# Patient Record
Sex: Male | Born: 1945 | Race: White | Hispanic: No | Marital: Married | Smoking: Former smoker
Health system: Southern US, Community
[De-identification: ages and names within clinical notes are randomized; demographics above are authoritative.]

## PROBLEM LIST (undated history)

## (undated) DIAGNOSIS — I739 Peripheral vascular disease, unspecified: Secondary | ICD-10-CM

## (undated) DIAGNOSIS — C801 Malignant (primary) neoplasm, unspecified: Secondary | ICD-10-CM

## (undated) DIAGNOSIS — E785 Hyperlipidemia, unspecified: Secondary | ICD-10-CM

## (undated) DIAGNOSIS — R600 Localized edema: Secondary | ICD-10-CM

## (undated) DIAGNOSIS — I714 Abdominal aortic aneurysm, without rupture, unspecified: Secondary | ICD-10-CM

## (undated) DIAGNOSIS — I10 Essential (primary) hypertension: Secondary | ICD-10-CM

## (undated) HISTORY — DX: Essential (primary) hypertension: I10

## (undated) HISTORY — PX: CAROTID ENDARTERECTOMY: SUR193

## (undated) HISTORY — DX: Localized edema: R60.0

## (undated) HISTORY — PX: PROSTATE BIOPSY: SHX241

## (undated) HISTORY — DX: Peripheral vascular disease, unspecified: I73.9

## (undated) HISTORY — DX: Hyperlipidemia, unspecified: E78.5

## (undated) HISTORY — PX: COLONOSCOPY: SHX174

## (undated) HISTORY — DX: Malignant (primary) neoplasm, unspecified: C80.1

## (undated) HISTORY — DX: Abdominal aortic aneurysm, without rupture, unspecified: I71.40

---

## 2009-05-13 ENCOUNTER — Ambulatory Visit: Payer: Self-pay | Admitting: Vascular Surgery

## 2009-06-17 ENCOUNTER — Ambulatory Visit: Payer: Self-pay | Admitting: Vascular Surgery

## 2009-06-19 ENCOUNTER — Encounter: Payer: Self-pay | Admitting: Vascular Surgery

## 2009-06-19 ENCOUNTER — Ambulatory Visit (HOSPITAL_COMMUNITY): Admission: RE | Admit: 2009-06-19 | Discharge: 2009-06-19 | Payer: Self-pay | Admitting: Vascular Surgery

## 2009-06-19 ENCOUNTER — Ambulatory Visit: Payer: Self-pay | Admitting: Vascular Surgery

## 2009-07-08 ENCOUNTER — Ambulatory Visit: Payer: Self-pay | Admitting: Vascular Surgery

## 2009-07-10 HISTORY — PX: PR VEIN BYPASS GRAFT,AORTO-FEM-POP: 35551

## 2009-07-21 ENCOUNTER — Ambulatory Visit: Payer: Self-pay | Admitting: Vascular Surgery

## 2009-07-21 ENCOUNTER — Inpatient Hospital Stay (HOSPITAL_COMMUNITY): Admission: RE | Admit: 2009-07-21 | Discharge: 2009-07-27 | Payer: Self-pay | Admitting: Vascular Surgery

## 2009-07-21 ENCOUNTER — Encounter: Payer: Self-pay | Admitting: Vascular Surgery

## 2009-07-22 ENCOUNTER — Encounter: Payer: Self-pay | Admitting: Vascular Surgery

## 2009-07-24 ENCOUNTER — Encounter: Payer: Self-pay | Admitting: Vascular Surgery

## 2009-08-07 ENCOUNTER — Ambulatory Visit: Payer: Self-pay | Admitting: Vascular Surgery

## 2009-08-12 ENCOUNTER — Ambulatory Visit: Payer: Self-pay | Admitting: Vascular Surgery

## 2009-10-14 ENCOUNTER — Ambulatory Visit: Payer: Self-pay | Admitting: Vascular Surgery

## 2010-01-20 ENCOUNTER — Ambulatory Visit: Payer: Self-pay | Admitting: Vascular Surgery

## 2010-07-22 ENCOUNTER — Ambulatory Visit: Payer: Self-pay | Admitting: Vascular Surgery

## 2011-01-13 LAB — HEPARIN LEVEL (UNFRACTIONATED)
Heparin Unfractionated: 0.25 IU/mL — ABNORMAL LOW (ref 0.30–0.70)
Heparin Unfractionated: 0.35 IU/mL (ref 0.30–0.70)

## 2011-01-13 LAB — COMPREHENSIVE METABOLIC PANEL
Albumin: 4.1 g/dL (ref 3.5–5.2)
Alkaline Phosphatase: 76 U/L (ref 39–117)
BUN: 7 mg/dL (ref 6–23)
CO2: 30 mEq/L (ref 19–32)
Calcium: 9.8 mg/dL (ref 8.4–10.5)
Creatinine, Ser: 0.76 mg/dL (ref 0.4–1.5)
GFR calc non Af Amer: >60 mL/min (ref >60)
Total Bilirubin: 0.6 mg/dL (ref 0.3–1.2)
Total Protein: 6.5 g/dL (ref 6.0–8.3)

## 2011-01-13 LAB — PROTIME-INR
INR: 1.37 (ref 0.00–1.49)
INR: 1.76 — ABNORMAL HIGH (ref 0.00–1.49)
Prothrombin Time: 16.8 seconds — ABNORMAL HIGH (ref 11.6–15.2)
Prothrombin Time: 20.4 seconds — ABNORMAL HIGH (ref 11.6–15.2)

## 2011-01-13 LAB — CBC
HCT: 31.1 % — ABNORMAL LOW (ref 39.0–52.0)
HCT: 31.3 % — ABNORMAL LOW (ref 39.0–52.0)
Hemoglobin: 10.8 g/dL — ABNORMAL LOW (ref 13.0–17.0)
Hemoglobin: 10.8 g/dL — ABNORMAL LOW (ref 13.0–17.0)
Hemoglobin: 11.2 g/dL — ABNORMAL LOW (ref 13.0–17.0)
Hemoglobin: 12.2 g/dL — ABNORMAL LOW (ref 13.0–17.0)
MCHC: 34.3 g/dL (ref 30.0–36.0)
MCHC: 34.7 g/dL (ref 30.0–36.0)
MCHC: 34.4 g/dL (ref 30.0–36.0)
MCV: 90.7 fL (ref 78.0–100.0)
MCV: 89.5 fL (ref 78.0–100.0)
Platelets: 210 10*3/uL (ref 150–400)
RBC: 3.43 MIL/uL — ABNORMAL LOW (ref 4.22–5.81)
RBC: 3.46 MIL/uL — ABNORMAL LOW (ref 4.22–5.81)
RDW: 14.1 % (ref 11.5–15.5)
RDW: 14.7 % (ref 11.5–15.5)
RDW: 14.8 % (ref 11.5–15.5)
WBC: 8.3 10*3/uL (ref 4.0–10.5)
WBC: 8.3 10*3/uL (ref 4.0–10.5)
WBC: 9.1 10*3/uL (ref 4.0–10.5)

## 2011-01-13 LAB — BASIC METABOLIC PANEL
BUN: 5 mg/dL — ABNORMAL LOW (ref 6–23)
Calcium: 7.7 mg/dL — ABNORMAL LOW (ref 8.4–10.5)
GFR calc Af Amer: >60 mL/min (ref >60)
Glucose, Bld: 135 mg/dL — ABNORMAL HIGH (ref 70–99)

## 2011-01-13 LAB — URINALYSIS, ROUTINE W REFLEX MICROSCOPIC
Bilirubin Urine: NEGATIVE
Nitrite: NEGATIVE
Protein, ur: NEGATIVE mg/dL

## 2011-01-13 LAB — TYPE AND SCREEN: Antibody Screen: NEGATIVE

## 2011-01-13 LAB — POCT I-STAT 4, (NA,K, GLUC, HGB,HCT)
Glucose, Bld: 149 mg/dL — ABNORMAL HIGH (ref 70–99)
HCT: 33 % — ABNORMAL LOW (ref 39.0–52.0)
Hemoglobin: 11.2 g/dL — ABNORMAL LOW (ref 13.0–17.0)
Hemoglobin: 8.8 g/dL — ABNORMAL LOW (ref 13.0–17.0)
Potassium: 3.7 mEq/L (ref 3.5–5.1)

## 2011-01-13 LAB — APTT: aPTT: 27 seconds (ref 24–37)

## 2011-01-13 LAB — ABO/RH: ABO/RH(D): AB POS

## 2011-02-22 NOTE — Procedures (Signed)
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   HISTORY:  Diabetes:  No.  Cardiac:  No.  Hypertension:  Yes.   EXAM:  Bilateral popliteal artery duplex.   IMPRESSION:  1. Patent right popliteal artery with measurements:  P=0.96 cm, M=1.18      cm, D=0.85 cm.  2. Occluded left popliteal artery with measurements:  P=1.69 cm,      M=1.34 cm, D=1.3 cm.    ___________________________________________  [name], MD   AS/MEDQ  D:  07/08/2009  T:  07/08/2009  Job:  [id number]

## 2011-02-22 NOTE — Assessment & Plan Note (Signed)
OFFICE VISIT   JUNE, VACHA  DOB:  06-01-1946                                       08/07/2009  ZOXWR#:[id number]   Francena Hanly presents today for a nurse visit for left lower tendon  bypass.  He has an appointment to see Dr. Darrick Penna in 1 week.  He asked if  I would check due to some oozing from his left popliteal incision.  He  does have a normal 2+ popliteal pulse.  His foot is well-perfused.  He  does have the usual amount of swelling.  He is on Coumadin for  postoperative DVT in the left superficial femoral vein.  He does have  some liquefying hematoma from the popliteal space.  I explained that  this is not in the circulation bleeding and that this will continue to  evacuate over time.  He was reassured of this discussion and will see  Dr. Darrick Penna as scheduled.   Larina Earthly, M.D.  Electronically Signed   TFE/MEDQ  D:  08/07/2009  T:  08/10/2009  Job:  [id number]

## 2011-02-22 NOTE — Assessment & Plan Note (Signed)
OFFICE VISIT   Levi Mcconnell, Levi Mcconnell  DOB:  1946-01-03                                       10/14/2009  EAVWU#:[id number]   The patient returns for followup today.  He previously underwent left  femoral to posterior tibial bypass in October of 2010.  He also had a  DVT at the time of his bypass secondary to a superficial femoral vein  injury.  He returns today for further followup.  He states he has  essentially returned to normal activities at this point.  He still has  some swelling in the left lower extremity but this resolves some with  elevation of the leg.  He has some occasional numbness and tingling in  his toes but overall the foot is improved.  I am pleased to report that  he has quit smoking at this time and I congratulated him on this.   PHYSICAL EXAM:  Today blood pressure is 156/97 in the left arm, heart  rate 63 and regular, temperature 98.  Left lower extremity has a 2+  femoral and 2+ posterior tibial pulse.  He has trace swelling in the  left lower extremity with the left lower extremity being approximately  10% larger than the right in the calf to the foot.  He has no open  ulcerations on the feet.   He had a graft duplex scan today which shows a widely patent bypass  graft with an ABI of 1.15 on the left and 1.05 on the right.  He had a  DVT ultrasound today which shows the superficial femoral vein is widely  patent with no residual areas of thrombus.   The patient is doing well at this point.  He does have some residual  swelling which I think is more due to reperfusion rather than his prior  DVT.  I did talk with him today about using a compression garment if the  swelling is bothersome to him long-term.  He prefers to defer this for  now.  Since his DVT has resolved and he has had 3 months of total  Coumadin therapy I believe we can discontinue his Coumadin at this time.  He will follow up in our graft surveillance protocol.     [name], MD  Electronically Signed   CEF/MEDQ  D:  10/14/2009  T:  10/15/2009  Job:  [id number]   cc:   Durenda Hurt, M.D.

## 2011-02-22 NOTE — Assessment & Plan Note (Signed)
OFFICE VISIT   HOY, FALLERT  DOB:  December 18, 1945                                       08/12/2009  GEXBM#:[id number]   The patient returns for followup today after undergoing a left femoral  to posterior tibial artery bypass with endarterectomy of the left tibia  peroneal trunk and repair of the left superficial femoral vein.  He did  have a DVT postoperatively.  He is currently on Coumadin for this.   He states that his walking is improved.  His rest pain in his left foot  has completely resolved.   On exam today ABIs were 1.12 on the right and 1.11 on the left.  Blood  pressure is 108/75, temperature 98.4, heart rate 69 and regular.  Left  lower extremity incisions are healing well.  He does have some edema in  the left lower extremity.  This does not really proceed into the thigh.   Overall the patient is doing well.  He will return in 2 months' time for  an ultrasound to reassess his lower extremity veins for presence of DVT.  He will return to work on November 22.  I have encouraged him to  continue to elevate his leg to prevent edema in the long-term.   [name], MD  Electronically Signed   CEF/MEDQ  D:  08/12/2009  T:  08/13/2009  Job:  [id number]   cc:   Durenda Hurt, M.D.

## 2011-02-22 NOTE — Assessment & Plan Note (Signed)
OFFICE VISIT   Levi Mcconnell, Levi Mcconnell  DOB:  June 25, 1946                                       05/13/2009  ZOXWR#:[id number]   The patient is a 65 year old male who has had progressive worsening of  left calf claudication over the last 2 months.  He states that the pain  does occur on a daily basis but sometimes his distances are longer than  others.  He states that on average he is able to walk approximately 1  block.  He also has some element of rest pain and has pain in his left  foot at night time that keeps him from sleeping.  He occasionally takes  a half a Vicodin tablet for this.   His atherosclerotic risk factors include elevated cholesterol,  hypertension and smoking.  He currently smokes 1 pack of cigarettes per  day.  We had a lengthy discussion greater than 5 minutes today on the  risks, benefits and techniques for smoking cessation.  He will try to  quit.   PAST MEDICAL HISTORY:  Is otherwise as listed above.   FAMILY HISTORY:  Unremarkable.   SOCIAL HISTORY:  He is married, has two children.  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  Smoking history as above.  He does not consume  alcohol.   REVIEW OF SYSTEMS:  GENERAL:  He is 5 feet 11 inches, 167 pounds.  Cardiac, pulmonary, GI, renal, vascular, neurologic, orthopedic,  psychiatric, ENT, hematologic review of systems are otherwise negative.   MEDICATIONS:  1. Enalapril 2.5 mg once daily.  2. Simvastatin 10 mg once a day.  3. Vicodin p.r.n.   ALLERGIES:  He has no known drug allergies.   PHYSICAL EXAM:  Blood pressure is 111/77 in the left arm, pulse is 100  and regular.  HEENT:  Unremarkable.  Neck:  Has 2+ carotid pulses  without bruit.  Chest:  Clear to auscultation.  Heart:  Regular rate and  rhythm without murmur.  Abdomen:  Is soft, nontender, nondistended.  No  masses.  He does have a right upper abdomen sebaceous cyst which has  been chronic in character and is not inflamed.   Upper extremities:  He  has 2+ brachial pulses and 1+ radial pulses bilaterally.  Lower  extremities:  He has 2+ femoral pulses bilaterally.  He has a 1+ right  popliteal pulse and a 2+ right posterior tibial pulse.  He has absent  popliteal and pedal pulses in the left leg.  He has absent right  dorsalis pedis pulse.  Feet are pink, warm and reasonably well-perfused.  He has no ulcerations on the feet.  He has no edema in the lower  extremities.  Neurologic:  Shows symmetric upper extremity and lower  extremity motor strength which is 5/5.   He had ABIs performed prior to his office visit today which were  reviewed.  These were done on 05/11/2009, shows an ABI on the left of  0.44 and on the right of 1.0.  Posterior tibial absolute pressure was 70  mmHg.   In summary, the patient has short distance claudication in his left  lower extremity and some element of early rest pain.  He has ABIs  consistent with that as well.   I discussed with the patient and his son today options of medical  management versus a  revascularization intervention at this point.  Although his ABI is 0.44 I believe that we may be able to make some  progress if he is able to quit smoking and start walking 30 minutes  daily.  I have also started him on Pletal today.  Since he does have  some element of rest pain we will follow him fairly closely.  He will  return to the office in 1 month's time to see if he has had any  improvement and we will repeat his ABIs at that time.  If he has not had  significant improvement at that time we may need to consider an  intervention at that point.   [name], MD  Electronically Signed   CEF/MEDQ  D:  05/13/2009  T:  05/14/2009  Job:  [id number]   cc:   Durenda Hurt, M.D.

## 2011-02-22 NOTE — Assessment & Plan Note (Signed)
OFFICE VISIT   Levi Mcconnell, Levi Mcconnell  DOB:  [birth year]                                       07/08/2009  FAOZH#:[id number]   Patient returns for follow-up today.  He underwent aortogram of  bilateral lower extremity runoff on September 10th.  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  He had 2-vessel runoff via the  peroneal artery and posterior tibial artery.  However, he also had an  area of occlusion in the posterior tibial artery at the level of the  ankle.  He still describes very short distance claudication at about 5  to 10 steps in his left leg.  He also has symptoms of early rest pain  with some pins and needles and tingling in his left foot.  He is  requiring narcotic pain medication at nighttime for sleep.   His atherosclerotic risk factors remain elevated cholesterol,  hypertension, smoking.  He is trying to continue to cut back on smoking.   PAST MEDICAL HISTORY:  Unremarkable.   SOCIAL HISTORY:  He is married.  He has 2 children.  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  He does not consume alcohol regularly.   FAMILY HISTORY:  His father had a thoracic aneurysm.   MEDICATIONS:  Enalapril 2.5 mg once a day, simvastatin 10 mg once a day,  Vicodin p.r.n., Pletal 100 mg twice a day.   PHYSICAL EXAMINATION:  Blood pressure is 155/94 in the left arm.  Pulse  is 70 and regular.  HEENT is unremarkable.  Neck has 2+ carotid pulses  without a bruit.  Chest:  Clear to auscultation.  Cardiac:  Regular rate  and rhythm without murmur.  Abdomen is soft, nontender, no pulsatile  mass.  Extremities:  He has 2+ femoral pulses bilaterally but has absent  popliteal and pedal pulses on the left side.  He has a 1+ right  popliteal pulse and a 2+ right posterior tibial pulse.  Most recent ABIs  were 0.38 and 1.13 on the right.   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  Of note, his recent arteriogram  showed that he also had a small infrarenal abdominal aortic aneurysm.   In summary, Mr. Levi Mcconnell has very short-distance claudication and early  rest pain in his left foot.  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  I believe the best option for his left leg at  this point would be a left femoral to tibial artery bypass, most likely  using either the peroneal, posterior tibial, or even the tibioperoneal  trunk.  If I find that this is patent, we will try to use a vein conduit  for this.  I discussed with him today that if the left leg vein was not  usable, we would consider using vein from the right leg for longer  durability.  Risks, benefits, possible complications and procedure  details were explained to the patient today, including but not limited  to bleeding, infection, myocardial events, graft occlusion, and limb  loss long-term.  He understands and agrees  to proceed.  This is  scheduled for July 21, 2009.  At some point during his follow-up, we  will obtain an aortic ultrasound to determine the precise diameter of  his abdominal aorta.   Levi Hora. Fields, MD  Electronically Signed   CEF/MEDQ  D:  07/08/2009  T:  07/09/2009  Job:  [id number]   cc:   Durenda Hurt, M.D.

## 2011-02-22 NOTE — Assessment & Plan Note (Signed)
OFFICE VISIT   Levi Mcconnell, COLARUSSO  DOB:  1945-12-29                                       06/17/2009  VHQIO#:[id number]   The patient returns today for further follow-up for claudication  symptoms.  He was last seen in August 2010.  He is a 65 year old male  who has short distance claudication and early rest pain symptoms in the  left foot.  Since he was last seen, unfortunately, he has still not been  able to quit smoking but has made attempts to try this.  He can walk for  up to 15-20 minutes at a time before experiencing claudication symptoms  in his left leg.  However, he has episodes at night time where his left  foot aches and burns and wakes him up several times from sleep at night.  His wife was here for the office visit today and she states that he is  getting up 1-2 times at night to rub his left foot and move around and  take pain medication.  His atherosclerotic risk factors continue to  remain elevated cholesterol, hypertension and smoking.  We again  discussed smoking cessation today.   PAST MEDICAL HISTORY:  Unremarkable.   SOCIAL HISTORY:  He is married.  He has 2 children.  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  He does not consume alcohol regularly.   MEDICATIONS:  Enalapril 2.5 mg once a day, simvastatin 10 mg once a day,  Vicodin p.r.n., Pletal 100 mg twice a day.   PHYSICAL EXAM:  Blood pressure is 134/96 in the left arm, pulse is 65  and regular.  Lower Extremities:  He has 2+ femoral pulses, he has a 1+  right popliteal pulse and a 2+ right posterior tibial pulse.  He has  absent popliteal and pedal pulses on the left side.  He had bilateral  ABIs performed today which were 0.38 on the left and 1.13 on the right,  he had monophasic waveforms on the left.   The patient has not had significant improvement in his symptoms with  Pletal.  I believe the best option for him at this point would be an  arteriogram with lower extremity  runoff.  His ABIs are reduced  significantly enough that he certainly has some rest pain symptoms  attributable to this.  I did describe with him today that the perfusion  to his left foot is significant enough that he is at risk of limb loss.  I discussed with him today the risks, benefits, possible complications  and procedure details of arteriography as well as possible angioplasty  and stenting in his left leg.  These included, but not limited to  bleeding, vessel injury, contrast reaction.  He understands and agrees  to proceed.  His arteriogram is scheduled for June 19, 2009.   [name], MD  Electronically Signed   CEF/MEDQ  D:  06/17/2009  T:  06/18/2009  Job:  [id number]

## 2011-02-22 NOTE — Procedures (Signed)
BYPASS GRAFT EVALUATION   INDICATION:  Followup left femoral-posterior tibial artery bypass graft.   HISTORY:  Diabetes:  No.  Cardiac:  No.  Hypertension:  On medications.  Smoking:  Previous.  Previous Surgery:  Left femoral-posterior tibial artery bypass graft  07/21/2009 by Dr. Darrick Penna.   SINGLE LEVEL ARTERIAL EXAM                               RIGHT              LEFT  Brachial:                    132                138  Anterior tibial:             150                145  Posterior tibial:            161                161  Peroneal:  Ankle/brachial index:        1.17               1.17   PREVIOUS ABI:  Date:  10/14/2009  RIGHT:  1.05  LEFT:  1.15   LOWER EXTREMITY BYPASS GRAFT DUPLEX EXAM:   DUPLEX:  Doppler arterial waveforms appear biphasic proximal to, within  and distal to femoral-posterior tibial artery bypass graft.   IMPRESSION:  1. Patent left femoral-posterior tibial artery bypass graft.  2. Ankle brachial indices appear within normal limits and stable.  3. No significant changes from previous study.        ___________________________________________  Janetta Hora Fields, MD   AS/MEDQ  D:  01/20/2010  T:  01/20/2010  Job:  [id number]

## 2011-02-22 NOTE — Procedures (Signed)
DUPLEX DEEP VENOUS EXAM - LOWER EXTREMITY   INDICATION:  Follow up known DVT.   HISTORY:  Edema:  Minimal.  Trauma/Surgery:  Left bypass graft.  Pain:  No.  PE:  No.  Previous DVT:  Yes.  Anticoagulants:  Yes.  Other:   DUPLEX EXAM:                CFV   SFV   PopV  PTV    GSV                R  L  R  L  R  L  R   L  R  L  Thrombosis    o  o     o     o      o     o  Spontaneous   +  +     +     +      +     +  Phasic        +  +     +     +      +     +  Augmentation  +  +     +     +      +     +  Compressible  +  +     +     +      +     +  Competent     +  +     +     +      +     +   Legend:  + - yes  o - no  p - partial  D - decreased   IMPRESSION:  There does not appear to be any deep venous thrombus noted  in the left leg.    _____________________________  Janetta Hora Fields, MD   CB/MEDQ  D:  10/14/2009  T:  10/14/2009  Job:  [id number]

## 2011-02-22 NOTE — Procedures (Signed)
BYPASS GRAFT EVALUATION   INDICATION:  Evaluation of left femoral to posterior tibial artery  bypass graft   HISTORY:  Diabetes:  no  Cardiac:  no  Hypertension:  yes  Smoking:  Previous, quit 07/21/2009  Previous Surgery:  Left femoral to posterior tibial artery bypass graft  07/21/2009   SINGLE LEVEL ARTERIAL EXAM                               RIGHT              LEFT  Brachial:                    171                167  Anterior tibial:             179                189  Posterior tibial:            172                197  Peroneal:  Ankle/brachial index:        1.05               1.15   PREVIOUS ABI:  Date: 08/12/2009  RIGHT:  1.11  LEFT:  1.11   LOWER EXTREMITY BYPASS GRAFT DUPLEX EXAM:   DUPLEX:  Left femoral posterior tibial bypass graft appears patent with  biphasic waveforms noted within graft. There is no focal stenosis noted.   IMPRESSION:  1. Normal ankle brachial indices bilaterally.  2. Patent left femoral posterior tibial artery bypass graft.   ___________________________________________  [name], MD   CB/MEDQ  D:  10/14/2009  T:  10/14/2009  Job:  [id number]

## 2011-02-22 NOTE — Assessment & Plan Note (Signed)
OFFICE VISIT   Levi Mcconnell, Levi Mcconnell  DOB:  May 09, 1946                                       01/20/2010  ZOXWR#:[id number]   The patient returns for followup today.  He previously underwent left  femoral to posterior tibial artery bypass in October of 2010.  He  returns for further followup today.  He denies any new symptoms.  He  denies any symptoms of claudication.  He still has some occasional  numbness and tingling in his left foot.  The edema in his left leg has  almost completely resolved at this point.  He continues to elevate his  leg to prevent swelling.  He continues to refrain from smoking.  He is  on an aspirin daily.  He is now off his Chantix.   PAST MEDICAL HISTORY:  Is as previously mentioned and unchanged.   FAMILY HISTORY:  Unremarkable.   SOCIAL HISTORY:  He is married and has two children.  Currently works as  a Geneticist, molecular.  Has not been smoking since October of 2010.   REVIEW OF SYSTEMS:  He is 5 feet 11 inches, 170 pounds.  He denies  recent weight loss or gain.  He has no history of recent chest pain or  shortness of breath.   PHYSICAL EXAM:  Vital signs:  Blood pressure is 130/92 in the left arm,  pulse is 62 and regular.  Chest:  Clear to auscultation.  Cardiac:  Exam  is regular rate and rhythm without murmur.  Abdomen:  Soft, nontender,  nondistended.  No masses.  Extremities:  He has 2+ femoral pulses  bilaterally.  He has a 2+ left posterior tibial pulse.  He has no open  ulcerations on the feet.  He has a 2+ right posterior tibial pulse.   He had bilateral ABIs performed today which were 1.17 on the left, 1.17  on the right.  He had a graft duplex scan which showed no increased  velocities throughout the course of his graft.  This is essentially  unchanged from his last study.   The patient continues to do well status post his bypass graft.  Fortunately, he has been able to keep off the cigarettes.  He will  continue  to follow up in our graft surveillance protocol.     [name], MD  Electronically Signed   CEF/MEDQ  D:  01/21/2010  T:  01/21/2010  Job:  [id number]   cc:   Durenda Hurt, M.D.

## 2011-02-22 NOTE — Procedures (Signed)
BYPASS GRAFT EVALUATION   INDICATION:  Follow up femoral-to-posterior tibial artery bypass graft.   HISTORY:  Diabetes:  No.  Cardiac:  No.  Hypertension:  Yes.  Smoking:  Previous.  Previous Surgery:  Left fem-to-posterior tibial artery bypass graft on  07/21/2009 by Dr. Darrick Penna.   SINGLE LEVEL ARTERIAL EXAM                               RIGHT              LEFT  Brachial:                    141                138  Anterior tibial:             146                157  Posterior tibial:            175                162  Peroneal:  Ankle/brachial index:        1.24               1.15   PREVIOUS ABI:  Date: 01/20/2010  RIGHT:  1.17  LEFT:  1.17   LOWER EXTREMITY BYPASS GRAFT DUPLEX EXAM:   DUPLEX:  Doppler arterial waveforms appear biphasic proximal to, within,  and distal to femoral-to-posterior tibial artery bypass graft.   IMPRESSION:  1. Patent left femoral to posterior tibial artery bypass graft.  2. Ankle brachial indices appear within normal limits and stable.  3. No significant changes from previous study.        ___________________________________________  Janetta Hora Fields, MD   EM/MEDQ  D:  07/22/2010  T:  07/22/2010  Job:  [id number]

## 2011-06-14 ENCOUNTER — Encounter: Payer: Self-pay | Admitting: Vascular Surgery

## 2011-07-21 ENCOUNTER — Other Ambulatory Visit: Payer: Self-pay

## 2011-07-21 ENCOUNTER — Ambulatory Visit: Payer: Self-pay

## 2011-07-22 ENCOUNTER — Encounter: Payer: Self-pay | Admitting: *Deleted

## 2011-07-22 ENCOUNTER — Other Ambulatory Visit (INDEPENDENT_AMBULATORY_CARE_PROVIDER_SITE_OTHER): Payer: 59 | Admitting: *Deleted

## 2011-07-22 ENCOUNTER — Ambulatory Visit: Payer: Self-pay

## 2011-07-22 DIAGNOSIS — Z48812 Encounter for surgical aftercare following surgery on the circulatory system: Secondary | ICD-10-CM

## 2011-07-22 DIAGNOSIS — I739 Peripheral vascular disease, unspecified: Secondary | ICD-10-CM

## 2011-08-04 NOTE — Procedures (Unsigned)
BYPASS GRAFT EVALUATION  INDICATION:  Left lower extremity bypass graft.  HISTORY: Diabetes:  No. Cardiac:  No. Hypertension:  Yes. Smoking:  Previous. Previous Surgery:  Left femoral to posterior tibial artery bypass graft on 07/21/2009.  SINGLE LEVEL ARTERIAL EXAM                              RIGHT              LEFT Brachial: Anterior tibial: Posterior tibial: Peroneal: Ankle/brachial index:  PREVIOUS ABI:  Date:  RIGHT:  LEFT:  LOWER EXTREMITY BYPASS GRAFT DUPLEX EXAM:  DUPLEX:  Biphasic Doppler waveforms noted throughout the left lower extremity bypass graft and native vessels with no increase in velocity.  IMPRESSION: 1. Patent left femoral to posterior tibial artery bypass graft with no     stenosis. 2. Bilateral ankle brachial indices are noted in a separate worksheet.  ___________________________________________ [name], MD  CH/MEDQ  D:  07/26/2011  T:  07/26/2011  Job:  [id number]

## 2011-08-05 ENCOUNTER — Encounter: Payer: Self-pay | Admitting: Vascular Surgery

## 2011-08-08 ENCOUNTER — Other Ambulatory Visit: Payer: Self-pay

## 2011-08-08 DIAGNOSIS — I739 Peripheral vascular disease, unspecified: Secondary | ICD-10-CM

## 2011-08-08 DIAGNOSIS — Z48812 Encounter for surgical aftercare following surgery on the circulatory system: Secondary | ICD-10-CM

## 2011-09-28 DIAGNOSIS — C760 Malignant neoplasm of head, face and neck: Secondary | ICD-10-CM | POA: Insufficient documentation

## 2011-11-07 DIAGNOSIS — E785 Hyperlipidemia, unspecified: Secondary | ICD-10-CM | POA: Insufficient documentation

## 2012-02-10 DIAGNOSIS — C000 Malignant neoplasm of external upper lip: Secondary | ICD-10-CM | POA: Insufficient documentation

## 2012-08-03 ENCOUNTER — Other Ambulatory Visit: Payer: Self-pay | Admitting: *Deleted

## 2012-08-03 DIAGNOSIS — I739 Peripheral vascular disease, unspecified: Secondary | ICD-10-CM

## 2012-08-03 DIAGNOSIS — Z48812 Encounter for surgical aftercare following surgery on the circulatory system: Secondary | ICD-10-CM

## 2012-08-08 ENCOUNTER — Encounter: Payer: Self-pay | Admitting: Vascular Surgery

## 2012-08-09 ENCOUNTER — Ambulatory Visit (INDEPENDENT_AMBULATORY_CARE_PROVIDER_SITE_OTHER): Payer: 59 | Admitting: Vascular Surgery

## 2012-08-09 ENCOUNTER — Encounter (INDEPENDENT_AMBULATORY_CARE_PROVIDER_SITE_OTHER): Payer: 59 | Admitting: *Deleted

## 2012-08-09 ENCOUNTER — Encounter: Payer: Self-pay | Admitting: Vascular Surgery

## 2012-08-09 VITALS — BP 117/68 | HR 59 | Resp 18 | Ht 71.0 in | Wt 188.0 lb

## 2012-08-09 DIAGNOSIS — I739 Peripheral vascular disease, unspecified: Secondary | ICD-10-CM

## 2012-08-09 DIAGNOSIS — Z48812 Encounter for surgical aftercare following surgery on the circulatory system: Secondary | ICD-10-CM

## 2012-08-09 NOTE — Progress Notes (Signed)
[facility] [place] HISTORY AND PHYSICAL   History of Present Illness:  Patient is a 66 y.o. year old male who presents for evaluation of prior left femoral to posterior tibial artery bypass in October 2010.  He reports no claudication symptoms. He has no nonhealing ulcers. He denies rest pain. He is now retired. He admits he is been less active and not walking as much. He has gained some weight. He has continued to refrain from smoking. 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  Other medical problems include hypertension and hyperlipidemia. These are currently stable.  Past Medical History  Diagnosis Date  . Edema leg   . Hypertension   . Hyperlipidemia     Past Surgical History  Procedure Date  . Pr vein bypass graft,aorto-fem-pop 07/2009    left femoral to posterior tibial artery bypass  . Carotid endarterectomy      Social History History  Substance Use Topics  . Smoking status: Former Smoker    Types: Cigarettes    Quit date: 07/21/2009  . Smokeless tobacco: Not on file  . Alcohol Use: No    Family History Family History  Problem Relation Age of Onset  . Other Father     thoracic aneurysm    Allergies  No Known Allergies   Current Outpatient Prescriptions  Medication Sig Dispense Refill  . ALPRAZolam (XANAX) 0.25 MG tablet Take 1 mg by mouth at bedtime as needed.       . ARIPiprazole (ABILIFY) 5 MG tablet Take 5 mg by mouth daily.      Marland Kitchen aspirin 81 MG tablet Take 81 mg by mouth daily.        . Bisacodyl (DULCOLAX PO) Take by mouth.        . citalopram (CELEXA) 40 MG tablet Take 40 mg by mouth daily.        . Cyanocobalamin (VITAMIN B-12 PO) Take by mouth daily.        Marland Kitchen lisinopril-hydrochlorothiazide (PRINZIDE,ZESTORETIC) 10-12.5 MG per tablet Take 1 tablet by mouth daily.        . Multiple Vitamin (MULTIVITAMIN) tablet Take 1 tablet by mouth daily.      . pravastatin (PRAVACHOL) 40 MG tablet Take 40  mg by mouth daily.      . tadalafil (CIALIS) 5 MG tablet Take 5 mg by mouth daily as needed.      . Tamsulosin HCl (FLOMAX) 0.4 MG CAPS Take 0.4 mg by mouth.      Marland Kitchen buPROPion (WELLBUTRIN XL) 300 MG 24 hr tablet Take 300 mg by mouth daily.          ROS:   General:  No weight loss, Fever, chills  HEENT: No recent headaches, no nasal bleeding, no visual changes, no sore throat  Neurologic: No dizziness, blackouts, seizures. No recent symptoms of stroke or mini- stroke. No recent episodes of slurred speech, or temporary blindness.  Cardiac: No recent episodes of chest pain/pressure, no shortness of breath at rest.  No shortness of breath with exertion.  Denies history of atrial fibrillation or irregular heartbeat  Vascular: No history of rest pain in feet.  No history of claudication.  No history of non-healing ulcer, No history of DVT   Pulmonary: No home oxygen, no productive cough, no hemoptysis,  No asthma or wheezing  Musculoskeletal:  [ ]  Arthritis, [ ]  Low back pain,  [ ]  Joint pain  Hematologic:No history of hypercoagulable state.  No history of easy bleeding.  No history of anemia  Gastrointestinal: No hematochezia or melena,  No gastroesophageal reflux, no trouble swallowing  Urinary: [ ]  chronic Kidney disease, [ ]  on HD - [ ]  MWF or [ ]  TTHS, [ ]  Burning with urination, [ ]  Frequent urination, [ ]  Difficulty urinating;   Skin: No rashes  Psychological: No history of anxiety,  No history of depression   Physical Examination  Filed Vitals:   08/09/12 1417  BP: 117/68  Pulse: 59  Resp: 18  Height: 5\' 11"  (1.803 m)  Weight: 188 lb (85.276 kg)  SpO2: 99%    Body mass index is 26.22 kg/(m^2).  General:  Alert and oriented, no acute distress HEENT: Normal Neck: No bruit or JVD Pulmonary: Clear to auscultation bilaterally Cardiac: Regular Rate and Rhythm without murmur Abdomen: Soft, non-tender, non-distended, no mass Skin: No rash Extremity Pulses:  2+ radial,  brachial, femoral, right 1+ dorsalis pedis, left 2+ posterior tibial pulse Musculoskeletal: No deformity or edema  Neurologic: Upper and lower extremity motor 5/5 and symmetric  DATA: The patient had a graft duplex scan of bilateral ABIs today. I reviewed and interpreted this study. ABIs were greater than 1 bilaterally with triphasic waveforms. Left femoral to posterior tibial bypass graft is widely patent with no increased velocities.   ASSESSMENT: Overall patient is doing well with patent bypass to the left leg. He is asymptomatic. The bypass is patent.   PLAN:  He will continue yearly followup duplex scans. He will followup with me in 3 years.  Fabienne Bruns, MD [facility] [place] Office: [phone] Pager: [phone]

## 2012-09-28 ENCOUNTER — Other Ambulatory Visit: Payer: Self-pay | Admitting: *Deleted

## 2012-09-28 DIAGNOSIS — I739 Peripheral vascular disease, unspecified: Secondary | ICD-10-CM

## 2012-09-28 DIAGNOSIS — Z48812 Encounter for surgical aftercare following surgery on the circulatory system: Secondary | ICD-10-CM

## 2013-08-07 ENCOUNTER — Encounter: Payer: Self-pay | Admitting: Family

## 2013-08-08 ENCOUNTER — Ambulatory Visit (INDEPENDENT_AMBULATORY_CARE_PROVIDER_SITE_OTHER): Payer: Medicare Other | Admitting: Family

## 2013-08-08 ENCOUNTER — Encounter (HOSPITAL_COMMUNITY): Payer: 59

## 2013-08-08 ENCOUNTER — Encounter: Payer: Self-pay | Admitting: Family

## 2013-08-08 ENCOUNTER — Ambulatory Visit: Payer: Medicare Other | Admitting: Family

## 2013-08-08 ENCOUNTER — Other Ambulatory Visit (HOSPITAL_COMMUNITY): Payer: 59

## 2013-08-08 ENCOUNTER — Ambulatory Visit (HOSPITAL_COMMUNITY)
Admission: RE | Admit: 2013-08-08 | Discharge: 2013-08-08 | Disposition: A | Discharge status: Home / Self Care | Payer: Medicare Other | Source: Ambulatory Visit | Attending: Family | Admitting: Family

## 2013-08-08 ENCOUNTER — Ambulatory Visit (INDEPENDENT_AMBULATORY_CARE_PROVIDER_SITE_OTHER)
Admission: RE | Admit: 2013-08-08 | Discharge: 2013-08-08 | Disposition: A | Discharge status: Home / Self Care | Payer: Medicare Other | Source: Ambulatory Visit | Attending: Vascular Surgery | Admitting: Vascular Surgery

## 2013-08-08 VITALS — BP 105/72 | HR 56 | Resp 16 | Ht 70.0 in | Wt 187.0 lb

## 2013-08-08 DIAGNOSIS — Z48812 Encounter for surgical aftercare following surgery on the circulatory system: Secondary | ICD-10-CM

## 2013-08-08 DIAGNOSIS — I739 Peripheral vascular disease, unspecified: Secondary | ICD-10-CM

## 2013-08-08 NOTE — Progress Notes (Signed)
[facility] [place] HISTORY AND PHYSICAL -PAD  Previous Bypass Surgery/ Stent Placement: Yes  History of Present Illness Levi Mcconnell is a 67 y.o. male patient who is status post left femoral to posterior tibial artery bypass and endarterectomy of left tibioperoneal trunk in October 2010 by Dr. Darrick Penna. 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 He denies DVT history. Exercises at gym 5 days/week. He denies any history of TIA or stroke.   Pt. denies claudication in legs. The mild numbness/tingling remains in the tips of his left toes.  Pt. denies rest pain; denies night pain denies non healing ulcers on legs/feet. He denies any cardiac problems.  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  Pt Diabetic: No Pt smoker: former smoker, quit 4 years ago  Pt meds include: Statin :Yes ASA: Yes Other anticoagulants/antiplatelets: no  Past Medical History  Diagnosis Date  . Edema leg   . Hypertension   . Hyperlipidemia     Social History History  Substance Use Topics  . Smoking status: Former Smoker    Types: Cigarettes    Quit date: 07/21/2009  . Smokeless tobacco: Not on file  . Alcohol Use: No    Family History Family History  Problem Relation Age of Onset  . Other Father     thoracic aneurysm    Past Surgical History  Procedure Laterality Date  . Pr vein bypass graft,aorto-fem-pop  07/2009    left femoral to posterior tibial artery bypass  . Carotid endarterectomy      No Known Allergies  Current Outpatient Prescriptions  Medication Sig Dispense Refill  . ALPRAZolam (XANAX) 0.25 MG tablet Take 0.5 mg by mouth 4 (four) times daily.       . ARIPiprazole (ABILIFY) 5 MG tablet Take 10 mg by mouth daily.       Marland Kitchen aspirin 81 MG tablet Take 81 mg  by mouth daily.        . Cyanocobalamin (VITAMIN B-12 PO) Take 2,000 mg by mouth daily.       Marland Kitchen lisinopril-hydrochlorothiazide (PRINZIDE,ZESTORETIC) 10-12.5 MG per tablet Take 1 tablet by mouth daily.        . Multiple Vitamin (MULTIVITAMIN) tablet Take 1 tablet by mouth daily.      . pravastatin (PRAVACHOL) 40 MG tablet Take 40 mg by mouth daily.      . tadalafil (CIALIS) 5 MG tablet Take 5 mg by mouth daily as needed.      . Tamsulosin HCl (FLOMAX) 0.4 MG CAPS Take 0.4 mg by mouth.      . Bisacodyl (DULCOLAX PO) Take by mouth.        Marland Kitchen buPROPion (WELLBUTRIN XL) 300 MG 24 hr tablet Take 300 mg by mouth daily.        . citalopram (CELEXA) 40 MG tablet Take 40 mg by mouth daily.         No current facility-administered medications for this visit.    ROS: [x]  Positive   [ ]  Denies  General:[ ]  Weight loss,  [ ]  Weight gain, [ ]  Fever, [ ]  chills Neurologic: [ ]  Dizziness, [ ]  Blackouts, [ ]  Seizure [ ]  Stroke, [ ]  "Mini stroke", [ ]  Slurred speech, [ ]  Temporary blindness;  [ ] weakness, [ ]  Hoarseness Cardiac: [ ]  Chest pain/pressure, [ ]   Shortness of breath at rest [ ]  Shortness of breath with exertion,  [ ]   Atrial fibrillation or irregular heartbeat Vascular:[ ]  Pain in legs with walking, [ ]  Pain in legs at rest ,[ ]  Pain in legs at night,  [ ]   Non-healing ulcer, [ ]  Blood clot in vein/DVT,   Pulmonary: [ ]  Home oxygen, [ ]   Productive cough, [ ]  Coughing up blood,  [ ]  Asthma,  [ ]  Wheezing Musculoskeletal:  [ ]  Arthritis, [ ]  Low back pain,  [ ]  Joint pain Hematologic:[ ]  Easy Bruising, [ ]  Anemia; [ ]  Hepatitis Gastrointestinal: [ ]  Blood in stool,  [ ]  Gastroesophageal Reflux, [ ]  Trouble swallowing Urinary: [ ]  chronic Kidney disease, [ ]  on HD, [ ]  Burning with urination, [ ]  Frequent urination, [ ]  Difficulty urinating;  Skin: [ ]  Rashes, [ ]  Wounds     Physical Examination  Filed Vitals:   08/08/13 1333  BP: 105/72  Pulse: 56  Resp: 16   Filed Weights   08/08/13  1333  Weight: 187 lb (84.823 kg)   Body mass index is 26.83 kg/(m^2).  General: A&O x 3, WDWN,  Gait: normal Eyes: PERRLA, Pulmonary: CTAB, without wheezes , rales or rhonchi Cardiac: regular Rythm , without murmur          Carotid Bruits Left Right   Negative Negative  Aorta: is palpable Radial pulses: are 1+ and equal                           VASCULAR EXAM: Extremities without ischemic changes  without Gangrene; without open wounds.                                                                                                          LE Pulses LEFT RIGHT       FEMORAL   palpable   palpable        POPLITEAL  not palpable   not palpable       POSTERIOR TIBIAL   palpable    palpable        DORSALIS PEDIS      ANTERIOR TIBIAL not palpable  not palpable    Abdomen: soft, NT, no masses. Skin: no rashes, no ulcers noted. Musculoskeletal: no muscle wasting or atrophy.  Neurologic: A&O X 3; Appropriate Affect ; SENSATION: normal; MOTOR FUNCTION:  moving all extremities equally, motor strength 5/5 throughout. Speech is fluent/normal. CN 2-12 intact.    Non-Invasive Vascular Imaging: DATE: 08/08/2013 ABI: RIGHT 1.21, Waveforms: biphasic;  LEFT 1.31, Waveforms: biphasic Previous ABI's (08/09/12): Right: 1.15, Left: 1.19 DUPLEX SCAN OF BYPASS:Patent left fem-tib BPG.  ASSESSMENT: Levi Mcconnell is a 67 y.o. male who is status post left femoral to posterior tibial artery bypass and endarterectomy of left tibioperoneal trunk in October 2010. Bilateral ABI's indicate no arterial occlusive disease. Duplex of left LE indicates patent left LE graft. All of his PVD risk factors seem to be addressed and he exercises most days of the  week; I congratulated him on his efforts and encouraged him to continue the same healthy lifestyle habits.  PLAN:  I discussed in depth with the patient the nature of atherosclerosis, and emphasized the importance of maximal medical management  including strict control of blood pressure, blood glucose, and lipid levels, obtaining regular exercise, and continued cessation of smoking.  The patient is aware that without maximal medical management the underlying atherosclerotic disease process will progress, limiting the benefit of any interventions. Based on the patient's vascular studies and examination, pt will return to clinic in 1 year.  The patient was given information about PAD including signs, symptoms, treatment, what symptoms should prompt the patient to seek immediate medical care, and risk reduction measures to take.  Charisse March, RN, MSN, FNP-C [facility] Phone: [phone]  Clinic MD: Darrick Penna  08/08/2013 1:46 PM

## 2013-08-08 NOTE — Patient Instructions (Signed)
Peripheral Vascular Disease Peripheral Vascular Disease (PVD), also called Peripheral Arterial Disease (PAD), is a circulation problem caused by cholesterol (atherosclerotic plaque) deposits in the arteries. PVD commonly occurs in the lower extremities (legs) but it can occur in other areas of the body, such as your arms. The cholesterol buildup in the arteries reduces blood flow which can cause pain and other serious problems. The presence of PVD can place a person at risk for Coronary Artery Disease (CAD).  CAUSES  Causes of PVD can be many. It is usually associated with more than one risk factor such as:   High Cholesterol.  Smoking.  Diabetes.  Lack of exercise or inactivity.  High blood pressure (hypertension).  Obesity.  Family history. SYMPTOMS   When the lower extremities are affected, patients with PVD may experience:  Leg pain with exertion or physical activity. This is called INTERMITTENT CLAUDICATION. This may present as cramping or numbness with physical activity. The location of the pain is associated with the level of blockage. For example, blockage at the abdominal level (distal abdominal aorta) may result in buttock or hip pain. Lower leg arterial blockage may result in calf pain.  As PVD becomes more severe, pain can develop with less physical activity.  In people with severe PVD, leg pain may occur at rest.  Other PVD signs and symptoms:  Leg numbness or weakness.  Coldness in the affected leg or foot, especially when compared to the other leg.  A change in leg color.  Patients with significant PVD are more prone to ulcers or sores on toes, feet or legs. These may take longer to heal or may reoccur. The ulcers or sores can become infected.  If signs and symptoms of PVD are ignored, gangrene may occur. This can result in the loss of toes or loss of an entire limb.  Not all leg pain is related to PVD. Other medical conditions can cause leg pain such  as:  Blood clots (embolism) or Deep Vein Thrombosis.  Inflammation of the blood vessels (vasculitis).  Spinal stenosis. DIAGNOSIS  Diagnosis of PVD can involve several different types of tests. These can include:  Pulse Volume Recording Method (PVR). This test is simple, painless and does not involve the use of X-rays. PVR involves measuring and comparing the blood pressure in the arms and legs. An ABI (Ankle-Brachial Index) is calculated. The normal ratio of blood pressures is 1. As this number becomes smaller, it indicates more severe disease.  < 0.95  indicates significant narrowing in one or more leg vessels.  <0.8 there will usually be pain in the foot, leg or buttock with exercise.  <0.4 will usually have pain in the legs at rest.  <0.25  usually indicates limb threatening PVD.  Doppler detection of pulses in the legs. This test is painless and checks to see if you have a pulses in your legs/feet.  A dye or contrast material (a substance that highlights the blood vessels so they show up on x-ray) may be given to help your caregiver better see the arteries for the following tests. The dye is eliminated from your body by the kidney's. Your caregiver may order blood work to check your kidney function and other laboratory values before the following tests are performed:  Magnetic Resonance Angiography (MRA). An MRA is a picture study of the blood vessels and arteries. The MRA machine uses a large magnet to produce images of the blood vessels.  Computed Tomography Angiography (CTA). A CTA is a   specialized x-ray that looks at how the blood flows in your blood vessels. An IV may be inserted into your arm so contrast dye can be injected.  Angiogram. Is a procedure that uses x-rays to look at your blood vessels. This procedure is minimally invasive, meaning a small incision (cut) is made in your groin. A small tube (catheter) is then inserted into the artery of your groin. The catheter is  guided to the blood vessel or artery your caregiver wants to examine. Contrast dye is injected into the catheter. X-rays are then taken of the blood vessel or artery. After the images are obtained, the catheter is taken out. TREATMENT  Treatment of PVD involves many interventions which may include:  Lifestyle changes:  Quitting smoking.  Exercise.  Following a low fat, low cholesterol diet.  Control of diabetes.  Foot care is very important to the PVD patient. Good foot care can help prevent infection.  Medication:  Cholesterol-lowering medicine.  Blood pressure medicine.  Anti-platelet drugs.  Certain medicines may reduce symptoms of Intermittent Claudication.  Interventional/Surgical options:  Angioplasty. An Angioplasty is a procedure that inflates a balloon in the blocked artery. This opens the blocked artery to improve blood flow.  Stent Implant. A wire mesh tube (stent) is placed in the artery. The stent expands and stays in place, allowing the artery to remain open.  Peripheral Bypass Surgery. This is a surgical procedure that reroutes the blood around a blocked artery to help improve blood flow. This type of procedure may be performed if Angioplasty or stent implants are not an option. SEEK IMMEDIATE MEDICAL CARE IF:   You develop pain or numbness in your arms or legs.  Your arm or leg turns cold, becomes blue in color.  You develop redness, warmth, swelling and pain in your arms or legs. MAKE SURE YOU:   Understand these instructions.  Will watch your condition.  Will get help right away if you are not doing well or get worse. Document Released: 11/03/2004 Document Revised: 12/19/2011 Document Reviewed: 09/30/2008 Exit[employer] Patient Information 2014 [employer].  

## 2014-08-06 ENCOUNTER — Encounter: Payer: Self-pay | Admitting: Family

## 2014-08-07 ENCOUNTER — Ambulatory Visit (INDEPENDENT_AMBULATORY_CARE_PROVIDER_SITE_OTHER): Payer: Medicare Other | Admitting: Family

## 2014-08-07 ENCOUNTER — Encounter: Payer: Self-pay | Admitting: Family

## 2014-08-07 ENCOUNTER — Ambulatory Visit (INDEPENDENT_AMBULATORY_CARE_PROVIDER_SITE_OTHER)
Admission: RE | Admit: 2014-08-07 | Discharge: 2014-08-07 | Disposition: A | Discharge status: Home / Self Care | Payer: Medicare Other | Source: Ambulatory Visit | Attending: Vascular Surgery | Admitting: Vascular Surgery

## 2014-08-07 ENCOUNTER — Ambulatory Visit (HOSPITAL_COMMUNITY)
Admission: RE | Admit: 2014-08-07 | Discharge: 2014-08-07 | Disposition: A | Discharge status: Home / Self Care | Payer: Medicare Other | Source: Ambulatory Visit | Attending: Family | Admitting: Family

## 2014-08-07 VITALS — BP 136/89 | HR 50 | Resp 14 | Ht 70.0 in | Wt 175.0 lb

## 2014-08-07 DIAGNOSIS — Z48812 Encounter for surgical aftercare following surgery on the circulatory system: Secondary | ICD-10-CM

## 2014-08-07 DIAGNOSIS — I739 Peripheral vascular disease, unspecified: Secondary | ICD-10-CM

## 2014-08-07 NOTE — Patient Instructions (Signed)

## 2014-08-07 NOTE — Progress Notes (Signed)
[facility] [place] HISTORY AND PHYSICAL -PAD  History of Present Illness Levi Mcconnell is a 68 y.o. male patient who is status post left femoral to posterior tibial artery bypass and endarterectomy of left tibioperoneal trunk in October 2010 by Dr. Oneida Alar.  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  He denies DVT history.  He exercises at a gym 5 days/week.  He denies any history of TIA or stroke.  Pt. denies claudication in legs.  The mild numbness/tingling remains in the tips of his left toes.  Pt. denies rest pain;  denies night pain  denies non healing ulcers on legs/feet.  He denies any cardiac problems.  Patient reports New Medical or Surgical History: a small AAA was found incidentally as part of liver evaluation, no liver abnormalities found; pt states pt states that his PCP is requesting the serial Duplex monitoring. He has been working with the [place]  Re his anxiety.  Pt Diabetic: No  Pt smoker: former smoker, quit in 2009  Pt meds include:  Statin :Yes  ASA: Yes  Other anticoagulants/antiplatelets: no   Past Medical History  Diagnosis Date  . Edema leg   . Hypertension   . Hyperlipidemia     Social History History  Substance Use Topics  . Smoking status: Former Smoker    Types: Cigarettes    Quit date: 07/21/2009  . Smokeless tobacco: Not on file  . Alcohol Use: No    Family History Family History  Problem Relation Age of Onset  . Other Father     thoracic aneurysm    Past Surgical History  Procedure Laterality Date  . Pr vein bypass graft,aorto-fem-pop  07/2009    left femoral to posterior tibial artery bypass  . Carotid endarterectomy      No Known Allergies  Current Outpatient Prescriptions  Medication Sig Dispense Refill  . ALPRAZolam (XANAX) 0.25 MG tablet Take 0.5 mg by mouth 4 (four) times daily.       . ARIPiprazole (ABILIFY) 5 MG tablet Take  10 mg by mouth daily.       Marland Kitchen aspirin 81 MG tablet Take 81 mg by mouth daily.        . Bisacodyl (DULCOLAX PO) Take by mouth.        Marland Kitchen buPROPion (WELLBUTRIN XL) 300 MG 24 hr tablet Take 300 mg by mouth daily.        . citalopram (CELEXA) 40 MG tablet Take 40 mg by mouth daily.        . Cyanocobalamin (VITAMIN B-12 PO) Take 2,000 mg by mouth daily.       Marland Kitchen lisinopril-hydrochlorothiazide (PRINZIDE,ZESTORETIC) 10-12.5 MG per tablet Take 1 tablet by mouth daily.        . Multiple Vitamin (MULTIVITAMIN) tablet Take 1 tablet by mouth daily.      . pravastatin (PRAVACHOL) 40 MG tablet Take 40 mg by mouth daily.      . tadalafil (CIALIS) 5 MG tablet Take 5 mg by mouth daily as needed.      . Tamsulosin HCl (FLOMAX) 0.4 MG CAPS Take 0.4 mg by mouth.       No current facility-administered medications for this visit.    ROS: See HPI for pertinent positives and negatives.   Physical Examination  Filed Vitals:   08/07/14 1628  BP: 136/89  Pulse: 50  Resp: 14  Height: 5\' 10"  (1.778  m)  Weight: 175 lb (79.379 kg)  SpO2: 98%   Body mass index is 25.11 kg/(m^2).  General: A&O x 3, WDWN,  Gait: normal  Eyes: PERRLA,  Pulmonary: CTAB, without wheezes , rales or rhonchi  Cardiac: regular Rythm , without detected murmur   Carotid Bruits  Left  Right    Negative  Negative   Aorta: is palpable  Radial pulses: are 2+ and equal   VASCULAR EXAM:  Extremities without ischemic changes  without Gangrene; without open wounds.   LE Pulses  LEFT  RIGHT   FEMORAL  2+palpable  1+palpable   POPLITEAL  1+ palpable  not palpable   POSTERIOR TIBIAL  Not palpable  palpable   DORSALIS PEDIS  ANTERIOR TIBIAL  not palpable  not palpable    Abdomen: soft, NT, no palpated masses.  Skin: no rashes, no ulcers noted.  Musculoskeletal: no muscle wasting or atrophy.  Neurologic: A&O X 3; Appropriate Affect ; SENSATION: normal; MOTOR FUNCTION: moving all extremities equally, motor strength 5/5 throughout.  Speech is fluent/normal. CN 2-12 intact.    Non-Invasive Vascular Imaging: DATE: 08/07/2014 LOWER EXTREMITY ARTERIAL DUPLEX EVALUATION    INDICATION: Follow-up left femorotibial arterial bypass graft     PREVIOUS INTERVENTION(S): Left femorotibial arterial bypass graft placed 07/21/2009     DUPLEX EXAM:     RIGHT  LEFT   Peak Systolic Velocity (cm/s) Ratio (if abnormal) Waveform  Peak Systolic Velocity (cm/s) Ratio (if abnormal) Waveform     Inflow Artery 127  T     Proximal Anastomosis 117  T     Proximal Graft 64  T     Mid Graft 45  T      Distal Graft 48  T     Distal Anastomosis 41  T     Outflow Artery 72  T  1.05 Today's ABI / TBI 1.15  1.21 Previous ABI / TBI (08/08/2013  ) 1.31    Waveform:    M - Monophasic       B - Biphasic       T - Triphasic  If Ankle Brachial Index (ABI) or Toe Brachial Index (TBI) performed, please see complete report  ADDITIONAL FINDINGS:     IMPRESSION: Widely patent left lower extremity bypass graft without evidence of restenosis or hyperplasia.     Compared to the previous exam:  No significant change compared to prior exam.      ASSESSMENT: Levi Mcconnell is a 68 y.o. male  who is status post left femoral to posterior tibial artery bypass and endarterectomy of left tibioperoneal trunk in October 2010. He has no claudication symptoms, no non healing wounds. Left LE arterial Duplex today reveals a widely patent left lower extremity bypass graft without evidence of restenosis or hyperplasia, bilateral ABI's remain normal.    PLAN:  I discussed in depth with the patient the nature of atherosclerosis, and emphasized the importance of maximal medical management including strict control of blood pressure, blood glucose, and lipid levels, obtaining regular exercise, and continued cessation of smoking.  The patient is aware that without maximal medical management the underlying atherosclerotic disease process will progress, limiting the  benefit of any interventions.  Based on the patient's vascular studies and examination, pt will return to clinic in 1 year for ABI's and left LE arterial Duplex.   The patient was given information about PAD including signs, symptoms, treatment, what symptoms should prompt the patient to seek immediate medical care, and risk  reduction measures to take.  Clemon Chambers, RN, MSN, FNP-C [facility] Phone: [phone]  Clinic MD: Oneida Alar  08/07/2014 4:19 PM

## 2014-11-24 ENCOUNTER — Other Ambulatory Visit: Payer: Self-pay | Admitting: Hematology

## 2015-02-02 ENCOUNTER — Other Ambulatory Visit: Payer: Self-pay | Admitting: *Deleted

## 2015-02-02 DIAGNOSIS — I714 Abdominal aortic aneurysm, without rupture, unspecified: Secondary | ICD-10-CM

## 2015-02-04 ENCOUNTER — Encounter: Payer: Self-pay | Admitting: Vascular Surgery

## 2015-02-05 ENCOUNTER — Other Ambulatory Visit: Payer: Self-pay | Admitting: *Deleted

## 2015-02-05 ENCOUNTER — Ambulatory Visit (HOSPITAL_COMMUNITY)
Admission: RE | Admit: 2015-02-05 | Discharge: 2015-02-05 | Disposition: A | Discharge status: Home / Self Care | Payer: Medicare Other | Source: Ambulatory Visit | Attending: Vascular Surgery | Admitting: Vascular Surgery

## 2015-02-05 ENCOUNTER — Encounter: Payer: Self-pay | Admitting: Vascular Surgery

## 2015-02-05 ENCOUNTER — Ambulatory Visit (INDEPENDENT_AMBULATORY_CARE_PROVIDER_SITE_OTHER): Payer: Medicare Other | Admitting: Vascular Surgery

## 2015-02-05 VITALS — BP 119/81 | HR 51 | Ht 70.0 in | Wt 177.5 lb

## 2015-02-05 DIAGNOSIS — I714 Abdominal aortic aneurysm, without rupture, unspecified: Secondary | ICD-10-CM

## 2015-02-05 DIAGNOSIS — Z48812 Encounter for surgical aftercare following surgery on the circulatory system: Secondary | ICD-10-CM

## 2015-02-05 DIAGNOSIS — I739 Peripheral vascular disease, unspecified: Secondary | ICD-10-CM

## 2015-02-05 NOTE — Addendum Note (Signed)
Addended by: Mena Goes on: 02/05/2015 02:58 PM   Modules accepted: Orders

## 2015-02-05 NOTE — Progress Notes (Signed)
[facility] [place] HISTORY AND PHYSICAL    History of Present Illness:  Levi Mcconnell is a 69 y.o. year old male who presents for evaluation of abdominal aortic aneurysm. He does have a family history of thoracic aneurysm in his father. He had a prior left femoral to posterior tibial artery bypass in October 2010.  He reports no claudication symptoms. He has no nonhealing ulcers. He denies rest pain. He is now retired.  He has continued to refrain from smoking.   Other medical problems include hypertension and hyperlipidemia. These are currently stable. His aneurysm was found on MRI scan September 2015. It was 3.9 cm in diameter at that point.     Past Medical History  Diagnosis Date  . Edema leg   . Hypertension   . Hyperlipidemia   . Peripheral vascular disease   . Cancer     squamous cell carcinoma    Past Surgical History  Procedure Laterality Date  . Pr vein bypass graft,aorto-fem-pop  07/2009    left femoral to posterior tibial artery bypass  . Carotid endarterectomy      Social History History   Substance Use Topics   .  Smoking status:  Former Smoker       Types:  Cigarettes       Quit date:  07/21/2009   .  Smokeless tobacco:  Not on file   .  Alcohol Use:  No     Family History Family History   Problem  Relation  Age of Onset   .  Other  Father         thoracic aneurysm     Allergies  No Known Allergies     Current Outpatient Prescriptions on File Prior to Visit  Medication Sig Dispense Refill  . ALPRAZolam (XANAX) 0.25 MG tablet Take 1 mg by mouth 4 (four) times daily.     Marland Kitchen amLODipine (NORVASC) 2.5 MG tablet Take 2.5 mg by mouth daily.    Marland Kitchen aspirin 81 MG tablet Take 81 mg by mouth daily.      . beta carotene w/minerals (OCUVITE) tablet Take 1 tablet by mouth daily. Monday- Wednesday- Friday    . Cyanocobalamin (VITAMIN B-12 PO) Take 2,000 mg by mouth daily.     Marland Kitchen docusate sodium (STOOL SOFTENER) 100 MG capsule Take 100 mg by mouth  daily.    Marland Kitchen guaiFENesin (MUCINEX) 600 MG 12 hr tablet Take 600 mg by mouth as needed.    Marland Kitchen ibuprofen (ADVIL,MOTRIN) 200 MG tablet Take 200 mg by mouth as needed.    . Melatonin 3 MG CAPS Take by mouth at bedtime.    . Multiple Vitamin (MULTIVITAMIN) tablet Take 1 tablet by mouth daily.    . pravastatin (PRAVACHOL) 40 MG tablet Take 40 mg by mouth daily.    . psyllium (METAMUCIL) 58.6 % powder Take 1 packet by mouth 2 (two) times daily. Tablets bid    . sertraline (ZOLOFT) 50 MG tablet Take 150 mg by mouth every morning.     . tadalafil (CIALIS) 5 MG tablet Take 5 mg by mouth daily as needed.    . Tamsulosin HCl (FLOMAX) 0.4 MG CAPS Take 0.4 mg by mouth.    . ARIPiprazole (ABILIFY) 5 MG tablet Take 10 mg by mouth daily.     . Bisacodyl (DULCOLAX PO) Take by mouth.      Marland Kitchen buPROPion (WELLBUTRIN XL) 300 MG 24 hr tablet Take 300 mg by mouth daily.      Marland Kitchen  citalopram (CELEXA) 40 MG tablet Take 40 mg by mouth daily.      Marland Kitchen lisinopril-hydrochlorothiazide (PRINZIDE,ZESTORETIC) 10-12.5 MG per tablet Take 1 tablet by mouth daily.       No current facility-administered medications on file prior to visit.    ROS:    General:  No weight loss, Fever, chills  HEENT: No recent headaches, no nasal bleeding, no visual changes, no sore throat  Neurologic: No dizziness, blackouts, seizures. No recent symptoms of stroke or mini- stroke. No recent episodes of slurred speech, or temporary blindness.  Cardiac: No recent episodes of chest pain/pressure, no shortness of breath at rest.  No shortness of breath with exertion.  Denies history of atrial fibrillation or irregular heartbeat  Vascular: No history of rest pain in feet.  No history of claudication.  No history of non-healing ulcer, No history of DVT    Pulmonary: No home oxygen, no productive cough, no hemoptysis,  No asthma or wheezing  Musculoskeletal:  [ ]  Arthritis, [ ]  Low back pain,  [ ]  Joint pain  Hematologic:No history of hypercoagulable  state.  No history of easy bleeding.  No history of anemia  Gastrointestinal: No hematochezia or melena,  No gastroesophageal reflux, no trouble swallowing  Urinary: [ ]  chronic Kidney disease, [ ]  on HD - [ ]  MWF or [ ]  TTHS, [ ]  Burning with urination, [ ]  Frequent urination, [ ]  Difficulty urinating;    Skin: No rashes  Psychological: + history of anxiety,  + history of depression   Physical Examination    Filed Vitals:   02/05/15 0919  BP: 119/81  Pulse: 51  Height: 5\' 10"  (1.778 m)  Weight: 177 lb 8 oz (80.513 kg)  SpO2: 96%    General:  Alert and oriented, no acute distress HEENT: Normal Neck: No bruit or JVD Pulmonary: Clear to auscultation bilaterally Cardiac: Regular Rate and Rhythm without murmur Abdomen: Soft, non-tender, non-distended, no mass Skin: No rash Extremity Pulses:  2+ radial, brachial, femoral, right 1+ dorsalis pedis, left 2+ posterior tibial pulse 2+ right posterior tibial pulse Musculoskeletal: No deformity or edema     Neurologic: Upper and lower extremity motor 5/5 and symmetric  DATA: The Levi Mcconnell had an ultrasound of his abdominal aorta today. This showed aortic diameter 3.6 cm.  ASSESSMENT: Overall Levi Mcconnell is doing well with patent bypass to the left leg. He is asymptomatic. The bypass is patent. He has a small abdominal aortic aneurysm.   PLAN:  he will follow-up with me in 6 months time. At that time we'll repeat his graft duplex scan ABIs. He will also have a follow-up abdominal aortic aneurysm ultrasound. We also need to consider doing a CT scan of his chest at some point in the future to rule out thoracic aneurysm.  Ruta Hinds, MD [facility] [place] Office: [phone] Pager: [phone]

## 2015-05-18 DIAGNOSIS — Z0279 Encounter for issue of other medical certificate: Secondary | ICD-10-CM

## 2015-05-26 ENCOUNTER — Other Ambulatory Visit: Payer: Self-pay

## 2015-08-06 ENCOUNTER — Encounter (HOSPITAL_COMMUNITY): Payer: Self-pay

## 2015-08-06 ENCOUNTER — Other Ambulatory Visit (HOSPITAL_COMMUNITY): Payer: Self-pay

## 2015-08-06 ENCOUNTER — Ambulatory Visit: Payer: Medicare Other | Admitting: Vascular Surgery

## 2015-08-13 ENCOUNTER — Ambulatory Visit: Payer: 59 | Admitting: Vascular Surgery

## 2015-08-13 ENCOUNTER — Ambulatory Visit (INDEPENDENT_AMBULATORY_CARE_PROVIDER_SITE_OTHER)
Admission: RE | Admit: 2015-08-13 | Discharge: 2015-08-13 | Disposition: A | Discharge status: Home / Self Care | Payer: Medicare Other | Source: Ambulatory Visit | Attending: Vascular Surgery | Admitting: Vascular Surgery

## 2015-08-13 ENCOUNTER — Ambulatory Visit (HOSPITAL_COMMUNITY)
Admission: RE | Admit: 2015-08-13 | Discharge: 2015-08-13 | Disposition: A | Discharge status: Home / Self Care | Payer: Medicare Other | Source: Ambulatory Visit | Attending: Vascular Surgery | Admitting: Vascular Surgery

## 2015-08-13 DIAGNOSIS — I739 Peripheral vascular disease, unspecified: Secondary | ICD-10-CM | POA: Diagnosis not present

## 2015-08-13 DIAGNOSIS — I714 Abdominal aortic aneurysm, without rupture, unspecified: Secondary | ICD-10-CM

## 2015-08-13 DIAGNOSIS — Z48812 Encounter for surgical aftercare following surgery on the circulatory system: Secondary | ICD-10-CM | POA: Diagnosis not present

## 2015-08-24 ENCOUNTER — Encounter: Payer: Self-pay | Admitting: Vascular Surgery

## 2015-08-26 ENCOUNTER — Ambulatory Visit: Payer: Medicare Other | Admitting: Vascular Surgery

## 2015-08-26 ENCOUNTER — Encounter: Payer: Self-pay | Admitting: Vascular Surgery

## 2015-08-26 ENCOUNTER — Ambulatory Visit (INDEPENDENT_AMBULATORY_CARE_PROVIDER_SITE_OTHER): Payer: Medicare Other | Admitting: Vascular Surgery

## 2015-08-26 VITALS — BP 136/85 | HR 64 | Temp 97.7°F | Resp 18 | Ht 69.0 in | Wt 199.0 lb

## 2015-08-26 DIAGNOSIS — I739 Peripheral vascular disease, unspecified: Secondary | ICD-10-CM | POA: Diagnosis not present

## 2015-08-26 DIAGNOSIS — I714 Abdominal aortic aneurysm, without rupture, unspecified: Secondary | ICD-10-CM

## 2015-08-26 NOTE — Progress Notes (Signed)
[facility] [place] HISTORY AND PHYSICAL    History of Present Illness:  Patient is a 69 y.o. year old male who presents for evaluation of abdominal aortic aneurysm. He does have a family history of thoracic aneurysm in his father. He had a prior left femoral to posterior tibial artery bypass in October 2010.  He reports no claudication symptoms. He has no nonhealing ulcers. He denies rest pain. He is now retired.  He has continued to refrain from smoking.   Other medical problems include hypertension and hyperlipidemia. These are currently stable. His aneurysm was found on MRI scan September 2015. It was 3.9 cm in diameter at that point.       Past Medical History   Diagnosis  Date   .  Edema leg     .  Hypertension     .  Hyperlipidemia     .  Peripheral vascular disease     .  Cancer         squamous cell carcinoma       Past Surgical History   Procedure  Laterality  Date   .  Pr vein bypass graft,aorto-fem-pop    07/2009       left femoral to posterior tibial artery bypass   .  Carotid endarterectomy         Social History History    Substance Use Topics    .   Smoking status:   Former Smoker          Types:   Cigarettes          Quit date:   07/21/2009    .   Smokeless tobacco:   Not on file    .   Alcohol Use:   No      Family History Family History    Problem   Relation   Age of Onset    .   Other   Father             thoracic aneurysm      Allergies  No Known Allergies     Current Outpatient Prescriptions on File Prior to Visit  Medication Sig Dispense Refill  . ALPRAZolam (XANAX) 0.25 MG tablet Take 1 mg by mouth 4 (four) times daily.     Marland Kitchen amLODipine (NORVASC) 2.5 MG tablet Take 2.5 mg by mouth daily.    Marland Kitchen aspirin 81 MG tablet Take 81 mg by mouth daily.      . beta carotene w/minerals (OCUVITE) tablet Take 1 tablet by mouth daily. Monday- Wednesday- Friday    . Bisacodyl (DULCOLAX PO) Take by mouth.      . Cyanocobalamin (VITAMIN  B-12 PO) Take 2,000 mg by mouth daily.     Marland Kitchen docusate sodium (STOOL SOFTENER) 100 MG capsule Take 100 mg by mouth daily.    Marland Kitchen guaiFENesin (MUCINEX) 600 MG 12 hr tablet Take 600 mg by mouth as needed.    Marland Kitchen ibuprofen (ADVIL,MOTRIN) 200 MG tablet Take 200 mg by mouth as needed.    . Melatonin 3 MG CAPS Take by mouth at bedtime.    . Multiple Vitamin (MULTIVITAMIN) tablet Take 1 tablet by mouth daily.    . pravastatin (PRAVACHOL) 40 MG tablet Take 40 mg by mouth daily.    . psyllium (METAMUCIL) 58.6 % powder Take 1 packet by mouth 2 (two) times daily. Tablets bid    . sertraline (ZOLOFT) 50 MG tablet Take 200 mg by mouth every morning.     Marland Kitchen  tadalafil (CIALIS) 5 MG tablet Take 5 mg by mouth daily as needed.    . Tamsulosin HCl (FLOMAX) 0.4 MG CAPS Take 0.4 mg by mouth.    . ARIPiprazole (ABILIFY) 5 MG tablet Take 10 mg by mouth daily.     Marland Kitchen buPROPion (WELLBUTRIN XL) 300 MG 24 hr tablet Take 300 mg by mouth daily.      . citalopram (CELEXA) 40 MG tablet Take 40 mg by mouth daily.      Marland Kitchen lisinopril-hydrochlorothiazide (PRINZIDE,ZESTORETIC) 10-12.5 MG per tablet Take 1 tablet by mouth daily.       No current facility-administered medications on file prior to visit.   ROS:    General:  No weight loss, Fever, chills  HEENT: No recent headaches, no nasal bleeding, no visual changes, no sore throat  Neurologic: No dizziness, blackouts, seizures. No recent symptoms of stroke or mini- stroke. No recent episodes of slurred speech, or temporary blindness.  Cardiac: No recent episodes of chest pain/pressure, no shortness of breath at rest.  No shortness of breath with exertion.  Denies history of atrial fibrillation or irregular heartbeat  Vascular: No history of rest pain in feet.  No history of claudication.  No history of non-healing ulcer, No history of DVT    Pulmonary: No home oxygen, no productive cough, no hemoptysis,  No asthma or wheezing  Musculoskeletal:  [ ]  Arthritis, [ ]  Low back pain,   [ ]  Joint pain  Hematologic:No history of hypercoagulable state.  No history of easy bleeding.  No history of anemia  Gastrointestinal: No hematochezia or melena,  No gastroesophageal reflux, no trouble swallowing  Urinary: [ ]  chronic Kidney disease, [ ]  on HD - [ ]  MWF or [ ]  TTHS, [ ]  Burning with urination, [ ]  Frequent urination, [ ]  Difficulty urinating;    Skin: No rashes  Psychological: + history of anxiety,  + history of depression   Physical Examination     Filed Vitals:   08/26/15 1346  BP: 136/85  Pulse: 64  Temp: 97.7 F (36.5 C)  Resp: 18  Height: 5\' 9"  (1.753 m)  Weight: 199 lb (90.266 kg)  SpO2: 97%    General:  Alert and oriented, no acute distress HEENT: Normal Neck: No bruit or JVD Pulmonary: Clear to auscultation bilaterally Cardiac: Regular Rate and Rhythm without murmur Abdomen: Soft, non-tender, non-distended, no mass Skin: No rash Extremity Pulses:  2+ radial, brachial, femoral, left 2+ posterior tibial pulse 2+ right posterior tibial pulse Musculoskeletal: No deformity or edema     Neurologic: Upper and lower extremity motor 5/5 and symmetric  DATA: The patient had an ultrasound of his abdominal aorta  08/13/2015. This showed aortic diameter 3.6 cm. No change over the last year.   He had bilateral ABIs which are greater than 1 bilaterally. Graft duplex scan of the left leg showed no significant stenosis.  ASSESSMENT: Overall patient is doing well with patent bypass to the left leg. He is asymptomatic. The bypass is patent. He has a small abdominal aortic aneurysm.   PLAN:   He will follow-up in one year. At that time we'll repeat his graft duplex scan ABIs. He will also have a follow-up abdominal aortic aneurysm ultrasound. We also need to consider doing a CT scan of his chest at some point in the future to rule out thoracic aneurysm.  Ruta Hinds, MD [facility] [place] Office: [phone] Pager: [phone]

## 2015-08-26 NOTE — Addendum Note (Signed)
Addended by: Dorthula Rue L on: 08/26/2015 03:48 PM   Modules accepted: Orders

## 2016-08-19 ENCOUNTER — Encounter: Payer: Self-pay | Admitting: Family

## 2016-08-25 ENCOUNTER — Encounter: Payer: Self-pay | Admitting: Family

## 2016-08-25 ENCOUNTER — Ambulatory Visit (HOSPITAL_COMMUNITY)
Admission: RE | Admit: 2016-08-25 | Discharge: 2016-08-25 | Disposition: A | Discharge status: Home / Self Care | Payer: Medicare Other | Source: Ambulatory Visit | Attending: Family | Admitting: Family

## 2016-08-25 ENCOUNTER — Ambulatory Visit (INDEPENDENT_AMBULATORY_CARE_PROVIDER_SITE_OTHER): Payer: Medicare Other | Admitting: Family

## 2016-08-25 ENCOUNTER — Ambulatory Visit (INDEPENDENT_AMBULATORY_CARE_PROVIDER_SITE_OTHER)
Admission: RE | Admit: 2016-08-25 | Discharge: 2016-08-25 | Disposition: A | Discharge status: Home / Self Care | Payer: Medicare Other | Source: Ambulatory Visit | Attending: Family | Admitting: Family

## 2016-08-25 VITALS — BP 131/77 | HR 45 | Temp 97.2°F | Resp 18 | Ht 70.0 in | Wt 196.0 lb

## 2016-08-25 DIAGNOSIS — I714 Abdominal aortic aneurysm, without rupture, unspecified: Secondary | ICD-10-CM

## 2016-08-25 DIAGNOSIS — I739 Peripheral vascular disease, unspecified: Secondary | ICD-10-CM

## 2016-08-25 NOTE — Patient Instructions (Addendum)
Abdominal Aortic Aneurysm Blood pumps away from the heart through tubes (blood vessels) called arteries. Aneurysms are weak or damaged places in the wall of an artery. It bulges out like a balloon. An abdominal aortic aneurysm happens in the main artery of the body (aorta). It can burst or tear, causing bleeding inside the body. This is an emergency. It needs treatment right away. What are the causes? The exact cause is unknown. Things that could cause this problem include:  Fat and other substances building up in the lining of a tube.  Swelling of the walls of a blood vessel.  Certain tissue diseases.  Belly (abdominal) trauma.  An infection in the main artery of the body. What increases the risk? There are things that make it more likely for you to have an aneurysm. These include:  Being over the age of 71 years old.  Having high blood pressure (hypertension).  Being a male.  Being white.  Being very overweight (obese).  Having a family history of aneurysm.  Using tobacco products. What are the signs or symptoms? Symptoms depend on the size of the aneurysm and how fast it grows. There may not be symptoms. If symptoms occur, they can include:  Pain (belly, side, lower back, or groin).  Feeling full after eating a small amount of food.  Feeling sick to your stomach (nauseous), throwing up (vomiting), or both.  Feeling a lump in your belly that feels like it is beating (pulsating).  Feeling like you will pass out (faint). How is this treated?  Medicine to control blood pressure and pain.  Imaging tests to see if the aneurysm gets bigger.  Surgery. How is this prevented? To lessen your chance of getting this condition:  Stop smoking. Stop chewing tobacco.  Limit or avoid alcohol.  Keep your blood pressure, blood sugar, and cholesterol within normal limits.  Eat less salt.  Eat foods low in saturated fats and cholesterol. These are found in animal and whole  dairy products.  Eat more fiber. Fiber is found in whole grains, vegetables, and fruits.  Keep a healthy weight.  Stay active and exercise often. This information is not intended to replace advice given to you by your health care provider. Make sure you discuss any questions you have with your health care provider. Document Released: 01/21/2013 Document Revised: 03/03/2016 Document Reviewed: 10/26/2012 Elsevier Interactive Patient Education  2017 [employer].     Peripheral Vascular Disease Peripheral vascular disease (PVD) is a disease of the blood vessels that are not part of your heart and brain. A simple term for PVD is poor circulation. In most cases, PVD narrows the blood vessels that carry blood from your heart to the rest of your body. This can result in a decreased supply of blood to your arms, legs, and internal organs, like your stomach or kidneys. However, it most often affects a person's lower legs and feet. There are two types of PVD.  Organic PVD. This is the more common type. It is caused by damage to the structure of blood vessels.  Functional PVD. This is caused by conditions that make blood vessels contract and tighten (spasm). Without treatment, PVD tends to get worse over time. PVD can also lead to acute ischemic limb. This is when an arm or limb suddenly has trouble getting enough blood. This is a medical emergency. Follow these instructions at home:  Take medicines only as told by your doctor.  Do not use any tobacco products, including cigarettes,  any tobacco products, including cigarettes, chewing tobacco, or electronic cigarettes. If you need help quitting, ask your doctor.  Lose weight if you are overweight, and maintain a healthy weight as told by your doctor.  Eat a diet that is low in fat and cholesterol. If you need help, ask your doctor.  Exercise regularly. Ask your doctor for some good activities for you.  Take good care of your feet. ? Wear comfortable shoes that fit  well. ? Check your feet often for any cuts or sores. Contact a doctor if:  You have cramps in your legs while walking.  You have leg pain when you are at rest.  You have coldness in a leg or foot.  Your skin changes.  You are unable to get or have an erection (erectile dysfunction).  You have cuts or sores on your feet that are not healing. Get help right away if:  Your arm or leg turns cold and blue.  Your arms or legs become red, warm, swollen, painful, or numb.  You have chest pain or trouble breathing.  You suddenly have weakness in your face, arm, or leg.  You become very confused or you cannot speak.  You suddenly have a very bad headache.  You suddenly cannot see. This information is not intended to replace advice given to you by your health care provider. Make sure you discuss any questions you have with your health care provider. Document Released: 12/21/2009 Document Revised: 03/03/2016 Document Reviewed: 03/06/2014 Elsevier Interactive Patient Education  2017 [employer]  

## 2016-08-25 NOTE — Progress Notes (Signed)
[facility] [place] HISTORY AND PHYSICAL   MRN : [id number]  History of Present Illness:   Levi Mcconnell is a 70 y.o. male patient who is status post left femoral to posterior tibial artery bypass and endarterectomy of left tibioperoneal trunk in October 2010 by Dr. Oneida Alar.  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  He denies DVT history.   He denies any history of TIA or stroke.  Pt. denies claudication symptoms in his legs with walking.   Pt. denies rest pain;  denies night pain  denies non healing ulcers on legs/feet.  He denies any cardiac problems.  Patient reports New Medical or Surgical History: a small AAA was found incidentally as part of liver evaluation, no liver abnormalities found; pt states pt states that his PCP is requesting the serial Duplex monitoring. He has been working with the [place]  Re his anxiety.  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  Pt Diabetic: No  Pt smoker: former smoker, quit in 2009  Pt meds include:  Statin :Yes, rosuvastatin  ASA: Yes  Other anticoagulants/antiplatelets: no   Current Outpatient Prescriptions  Medication Sig Dispense Refill  . ALPRAZolam (XANAX) 0.25 MG tablet Take 1 mg by mouth 4 (four) times daily.     Marland Kitchen amLODipine (NORVASC) 2.5 MG tablet Take 2.5 mg by mouth daily.    . ARIPiprazole (ABILIFY) 5 MG tablet Take 10 mg by mouth daily.     Marland Kitchen aspirin 81 MG tablet Take 81 mg by mouth daily.      . beta carotene w/minerals (OCUVITE) tablet Take 1 tablet by mouth daily. Monday- Wednesday- Friday    . Bisacodyl (DULCOLAX PO) Take by mouth.      Marland Kitchen buPROPion (WELLBUTRIN XL) 300 MG 24 hr tablet Take 300 mg by mouth daily.      . citalopram (CELEXA) 40 MG tablet Take 40 mg by mouth daily.      . Cyanocobalamin (VITAMIN B-12 PO) Take 2,000 mg by mouth daily.     Marland Kitchen docusate  sodium (STOOL SOFTENER) 100 MG capsule Take 100 mg by mouth daily.    Marland Kitchen GINKGO BILOBA PO Take 120 mg by mouth. Take 1 (one) po on Tuesday and Thursdays    . guaiFENesin (MUCINEX) 600 MG 12 hr tablet Take 600 mg by mouth as needed.    Marland Kitchen ibuprofen (ADVIL,MOTRIN) 200 MG tablet Take 200 mg by mouth as needed.    Marland Kitchen lisinopril-hydrochlorothiazide (PRINZIDE,ZESTORETIC) 10-12.5 MG per tablet Take 1 tablet by mouth daily.      . Melatonin 3 MG CAPS Take by mouth at bedtime.    . Multiple Vitamin (MULTIVITAMIN) tablet Take 1 tablet by mouth daily.    . pravastatin (PRAVACHOL) 40 MG tablet Take 40 mg by mouth daily.    . psyllium (METAMUCIL) 58.6 % powder Take 1 packet by mouth 2 (two) times daily. Tablets bid    . sertraline (ZOLOFT) 50 MG tablet Take 200 mg by mouth every morning.     . tadalafil (CIALIS) 5 MG tablet Take 5 mg by mouth daily as needed.    . Tamsulosin HCl (FLOMAX) 0.4 MG CAPS Take 0.4 mg by mouth.     No current facility-administered medications for this visit.     Past Medical History:  Diagnosis Date  . Cancer ([place])  squamous cell carcinoma  . Edema leg   . Hyperlipidemia   . Hypertension   . Peripheral vascular disease [facility])     Social History Social History  Substance Use Topics  . Smoking status: Former Smoker    Types: Cigarettes    Quit date: 07/21/2009  . Smokeless tobacco: Never Used  . Alcohol use 0.0 oz/week     Comment: rarely    Family History Family History  Problem Relation Age of Onset  . Other Father     thoracic aneurysm  . Heart disease Father     AAA-   After age 71  . Hyperlipidemia Mother   . Hypertension Mother   . Stroke Mother     Massive  . Heart attack Son   . Hyperlipidemia Son   . Hypertension Son   . Heart disease Son     Surgical History Past Surgical History:  Procedure Laterality Date  . CAROTID ENDARTERECTOMY    . COLONOSCOPY     July 2016  . PR VEIN BYPASS GRAFT,AORTO-FEM-POP  07/2009   left femoral to posterior  tibial artery bypass  . PROSTATE BIOPSY      No Known Allergies  Current Outpatient Prescriptions  Medication Sig Dispense Refill  . ALPRAZolam (XANAX) 0.25 MG tablet Take 1 mg by mouth 4 (four) times daily.     Marland Kitchen amLODipine (NORVASC) 2.5 MG tablet Take 2.5 mg by mouth daily.    . ARIPiprazole (ABILIFY) 5 MG tablet Take 10 mg by mouth daily.     Marland Kitchen aspirin 81 MG tablet Take 81 mg by mouth daily.      . beta carotene w/minerals (OCUVITE) tablet Take 1 tablet by mouth daily. Monday- Wednesday- Friday    . Bisacodyl (DULCOLAX PO) Take by mouth.      Marland Kitchen buPROPion (WELLBUTRIN XL) 300 MG 24 hr tablet Take 300 mg by mouth daily.      . citalopram (CELEXA) 40 MG tablet Take 40 mg by mouth daily.      . Cyanocobalamin (VITAMIN B-12 PO) Take 2,000 mg by mouth daily.     Marland Kitchen docusate sodium (STOOL SOFTENER) 100 MG capsule Take 100 mg by mouth daily.    Marland Kitchen GINKGO BILOBA PO Take 120 mg by mouth. Take 1 (one) po on Tuesday and Thursdays    . guaiFENesin (MUCINEX) 600 MG 12 hr tablet Take 600 mg by mouth as needed.    Marland Kitchen ibuprofen (ADVIL,MOTRIN) 200 MG tablet Take 200 mg by mouth as needed.    Marland Kitchen lisinopril-hydrochlorothiazide (PRINZIDE,ZESTORETIC) 10-12.5 MG per tablet Take 1 tablet by mouth daily.      . Melatonin 3 MG CAPS Take by mouth at bedtime.    . Multiple Vitamin (MULTIVITAMIN) tablet Take 1 tablet by mouth daily.    . pravastatin (PRAVACHOL) 40 MG tablet Take 40 mg by mouth daily.    . psyllium (METAMUCIL) 58.6 % powder Take 1 packet by mouth 2 (two) times daily. Tablets bid    . sertraline (ZOLOFT) 50 MG tablet Take 200 mg by mouth every morning.     . tadalafil (CIALIS) 5 MG tablet Take 5 mg by mouth daily as needed.    . Tamsulosin HCl (FLOMAX) 0.4 MG CAPS Take 0.4 mg by mouth.     No current facility-administered medications for this visit.      REVIEW OF SYSTEMS: See HPI for pertinent positives and negatives.  Physical Examination Vitals:   08/25/16 0944  BP: 131/77  Pulse: Marland Kitchen)  45   Resp: 18  Temp: 97.2 F (36.2 C)  SpO2: 98%  Weight: 196 lb (88.9 kg)  Height: 5\' 10"  (1.778 m)   Body mass index is 28.12 kg/m.  General:  A&O x 3, WDWN,  Gait: normal  Eyes: PERRLA,  Pulmonary: Respirations are non labored, CTAB, no wheezes, rales or rhonchi  Cardiac: regular rythm, no detected murmur   Carotid Bruits  Left  Right    Negative  Negative   Aorta: is palpable  Radial pulses: are 2+ and equal   VASCULAR EXAM:  Extremities without ischemic changes  without Gangrene; without open wounds.   LE Pulses  LEFT  RIGHT   FEMORAL  2+palpable  1+palpable   POPLITEAL  1+ palpable  not palpable   POSTERIOR TIBIAL  3+ palpable  3+palpable   DORSALIS PEDIS  ANTERIOR TIBIAL  not palpable  not palpable    Abdomen: soft, NT, no palpated masses.  Skin: no rashes, no ulcers noted.  Musculoskeletal: no muscle wasting or atrophy.  Neurologic: A&O X 3; Appropriate Affect ; SENSATION: normal; MOTOR FUNCTION: moving all extremities equally, motor strength 5/5 throughout. Speech is fluent/normal   CN 2-12 intact     ASSESSMENT:  PINK MOREIN is a 70 y.o. male who is status post left femoral to posterior tibial artery bypass and endarterectomy of left tibioperoneal trunk in October 2010. We are also monitoring a small AAA. He has no claudication symptoms with walking, no signs of ischemia in his feet/legs. He has no back pain nor abdominal pain.  Dr. Oneida Alar last evaluated pt on 08-26-15. At that time the pt was doing well with patent bypass to the left leg. He was asymptomatic. The bypass was patent. He had a small abdominal aortic aneurysm. We also need to consider doing a CT scan of his chest at some point in the future to rule out thoracic aneurysm.  DATA Today's bilateral ABI's and TBI's remain normal with tri and biphasic waveforms  AAA duplex today: Patent abdominal aorta measuring approximately 3.67 cm x 3.64 cm in diameter.  Bilateral iliac arteries not  adequately visualized due to overlying bowel gas. No significant change compared to the last exam on 08-13-15: largest diameter of AAA was 3.6 cm x 3.4 cm  PLAN:   Based on today's exam and non-invasive vascular lab results, and after discussing with Dr. Oneida Alar, the patient will follow up in 1 year with the following tests: CTA chest, screening evaluation for thoracic aortic aneurysm, AAA duplex, ABI's, see Dr. Oneida Alar afterward. I discussed in depth with the patient the nature of atherosclerosis, and emphasized the importance of maximal medical management including strict control of blood pressure, blood glucose, and lipid levels, obtaining regular exercise, and cessation of smoking.  The patient is aware that without maximal medical management the underlying atherosclerotic disease process will progress, limiting the benefit of any interventions. Consideration for repair of AAA would be made when the size is 4.8 or 5.0 cm, growth > 1 cm/yr, and symptomatic status.  The patient was given information about AAA including signs, symptoms, treatment,  what symptoms should prompt the patient to seek immediate medical care, and how to minimize the risk of enlargement and rupture of aneurysms. The patient was given information about PAD including signs, symptoms, treatment, what symptoms should prompt the patient to seek immediate medical care, and risk reduction measures to take. Thank you for allowing [place] to participate in this patient's care.  Vinnie Level Elroy Schembri, RN, MSN, FNP-C  [facility] Office: [phone]  Clinic MD: [facility] 08/25/2016 9:46 AM

## 2016-08-29 NOTE — Addendum Note (Signed)
Addended by: Lianne Cure A on: 08/29/2016 09:07 AM   Modules accepted: Orders

## 2016-09-20 DIAGNOSIS — Z01818 Encounter for other preprocedural examination: Secondary | ICD-10-CM | POA: Insufficient documentation

## 2016-09-20 DIAGNOSIS — I714 Abdominal aortic aneurysm, without rupture, unspecified: Secondary | ICD-10-CM | POA: Insufficient documentation

## 2017-09-07 ENCOUNTER — Encounter (HOSPITAL_COMMUNITY): Payer: Medicare Other

## 2017-09-07 ENCOUNTER — Other Ambulatory Visit (HOSPITAL_COMMUNITY): Payer: Medicare Other

## 2017-09-07 ENCOUNTER — Ambulatory Visit: Payer: Medicare Other | Admitting: Vascular Surgery

## 2017-09-07 ENCOUNTER — Ambulatory Visit
Admission: RE | Admit: 2017-09-07 | Discharge: 2017-09-07 | Disposition: A | Discharge status: Home / Self Care | Payer: Medicare Other | Source: Ambulatory Visit | Attending: Family | Admitting: Family

## 2017-09-07 DIAGNOSIS — I714 Abdominal aortic aneurysm, without rupture, unspecified: Secondary | ICD-10-CM

## 2017-09-07 MED ORDER — IOPAMIDOL (ISOVUE-370) INJECTION 76%
80.0000 mL | Freq: Once | INTRAVENOUS | Status: AC | PRN
  Administered 2017-09-07: 80 mL via INTRAVENOUS

## 2017-09-21 ENCOUNTER — Ambulatory Visit (INDEPENDENT_AMBULATORY_CARE_PROVIDER_SITE_OTHER): Payer: Medicare Other | Admitting: Vascular Surgery

## 2017-09-21 ENCOUNTER — Ambulatory Visit (INDEPENDENT_AMBULATORY_CARE_PROVIDER_SITE_OTHER)
Admission: RE | Admit: 2017-09-21 | Discharge: 2017-09-21 | Disposition: A | Discharge status: Home / Self Care | Payer: Medicare Other | Source: Ambulatory Visit | Attending: Family | Admitting: Family

## 2017-09-21 ENCOUNTER — Ambulatory Visit (HOSPITAL_COMMUNITY)
Admission: RE | Admit: 2017-09-21 | Discharge: 2017-09-21 | Disposition: A | Discharge status: Home / Self Care | Payer: Medicare Other | Source: Ambulatory Visit | Attending: Family | Admitting: Family

## 2017-09-21 ENCOUNTER — Encounter: Payer: Self-pay | Admitting: Vascular Surgery

## 2017-09-21 VITALS — BP 147/98 | HR 59 | Temp 97.1°F | Resp 16 | Ht 70.0 in | Wt 206.7 lb

## 2017-09-21 DIAGNOSIS — I714 Abdominal aortic aneurysm, without rupture, unspecified: Secondary | ICD-10-CM

## 2017-09-21 DIAGNOSIS — I739 Peripheral vascular disease, unspecified: Secondary | ICD-10-CM

## 2017-09-21 NOTE — Progress Notes (Signed)
Patient is a 71 year old male who is previously undergone left femoral to posterior tibial bypass.  We are also following him for a small abdominal aortic aneurysm.  He denies any abdominal or back pain.  He has no claudication symptoms.  He states he does not really walk much.  He has continued to refrain from tobacco products.  He is still struggling with PTSD.  He is followed at the [facility] system for this.  He is on aspirin and a statin.  Current Outpatient Medications on File Prior to Visit  Medication Sig Dispense Refill  . ALPRAZolam (XANAX) 0.25 MG tablet Take 1 mg by mouth 3 (three) times daily as needed.     Marland Kitchen amLODipine (NORVASC) 2.5 MG tablet Take 2.5 mg by mouth daily.    Marland Kitchen aspirin 81 MG tablet Take 81 mg by mouth daily.      . beta carotene w/minerals (OCUVITE) tablet Take 1 tablet by mouth daily. Monday- Wednesday- Friday    . Bisacodyl (DULCOLAX PO) Take by mouth.      . Brexpiprazole (REXULTI PO) Take 1 mg by mouth daily.     . Cyanocobalamin (VITAMIN B-12 PO) Take 2,000 mg by mouth daily.     Marland Kitchen docusate sodium (STOOL SOFTENER) 100 MG capsule Take 100 mg by mouth daily.    Marland Kitchen donepezil (ARICEPT) 10 MG tablet Take 10 mg by mouth at bedtime.    Marland Kitchen guaiFENesin (MUCINEX) 600 MG 12 hr tablet Take 600 mg by mouth as needed.    Marland Kitchen ibuprofen (ADVIL,MOTRIN) 200 MG tablet Take 200 mg by mouth as needed.    . Melatonin 3 MG CAPS Take by mouth at bedtime.    . Multiple Vitamin (MULTIVITAMIN) tablet Take 1 tablet by mouth daily.    . psyllium (METAMUCIL) 58.6 % powder Take 1 packet by mouth 2 (two) times daily. Tablets bid    . Rosuvastatin Calcium (CRESTOR PO) Take by mouth daily.    . sertraline (ZOLOFT) 50 MG tablet Take 100 mg by mouth every morning.      No current facility-administered medications on file prior to visit.      Review of systems: He denies shortness of breath.  He does have shortness of breath with exertion.  He denies chest pain.  Past Medical History:   Diagnosis Date  . Cancer ([facility])    squamous cell carcinoma  . Edema leg   . Hyperlipidemia   . Hypertension   . Peripheral vascular disease [facility])     Past Surgical History:  Procedure Laterality Date  . CAROTID ENDARTERECTOMY    . COLONOSCOPY     July 2016  . PR VEIN BYPASS GRAFT,AORTO-FEM-POP  07/2009   left femoral to posterior tibial artery bypass  . PROSTATE BIOPSY     Physical exam:  Vitals:   09/21/17 1034  BP: (!) 147/98  Pulse: (!) 59  Resp: 16  Temp: (!) 97.1 F (36.2 C)  TempSrc: Oral  SpO2: 96%  Weight: 206 lb 11.2 oz (93.8 kg)  Height: 5\' 10"  (1.778 m)    Abdomen: Soft nontender nondistended palpable aortic pulsation  Extremities: 2+ left posterior tibial pulse  Data: Patient had an aortic duplex exam today which showed the aneurysm diameter is 3.7-3.9 cm.  This is essentially unchanged from 2017.  Of note there was a 5 cm mixed echogenicity lesion in the liver previously this has been documented on CT scan to be consistent with hemangioma.  Assessment: #1 peripheral arterial disease patent  left lower extremity posterior tibial bypass.  The patient has continued to refrain from smoking he is doing very well from this standpoint I did encourage him to walk more.  We will repeat his ABIs in 6 months.  2.  Abdominal aortic aneurysm 3.9 cm diameter would consider repair at 5.5 cm in diameter patient will have a follow-up ultrasound in 6 months.  Ruta Hinds, MD [facility] [place] Office: [phone] Pager: [phone]

## 2017-09-22 NOTE — Addendum Note (Signed)
Addended by: Lianne Cure A on: 09/22/2017 11:15 AM   Modules accepted: Orders

## 2018-03-22 ENCOUNTER — Ambulatory Visit (HOSPITAL_COMMUNITY)
Admission: RE | Admit: 2018-03-22 | Discharge: 2018-03-22 | Disposition: A | Discharge status: Home / Self Care | Payer: Medicare Other | Source: Ambulatory Visit | Attending: Family | Admitting: Family

## 2018-03-22 ENCOUNTER — Ambulatory Visit (INDEPENDENT_AMBULATORY_CARE_PROVIDER_SITE_OTHER): Payer: Medicare Other | Admitting: Family

## 2018-03-22 ENCOUNTER — Encounter: Payer: Self-pay | Admitting: Family

## 2018-03-22 ENCOUNTER — Ambulatory Visit (INDEPENDENT_AMBULATORY_CARE_PROVIDER_SITE_OTHER)
Admission: RE | Admit: 2018-03-22 | Discharge: 2018-03-22 | Disposition: A | Discharge status: Home / Self Care | Payer: Medicare Other | Source: Ambulatory Visit | Attending: Family | Admitting: Family

## 2018-03-22 VITALS — BP 158/99 | HR 55 | Temp 97.7°F | Resp 16 | Ht 70.0 in | Wt 205.0 lb

## 2018-03-22 DIAGNOSIS — I739 Peripheral vascular disease, unspecified: Secondary | ICD-10-CM | POA: Diagnosis present

## 2018-03-22 DIAGNOSIS — R0989 Other specified symptoms and signs involving the circulatory and respiratory systems: Secondary | ICD-10-CM

## 2018-03-22 DIAGNOSIS — I714 Abdominal aortic aneurysm, without rupture, unspecified: Secondary | ICD-10-CM

## 2018-03-22 DIAGNOSIS — I779 Disorder of arteries and arterioles, unspecified: Secondary | ICD-10-CM | POA: Diagnosis not present

## 2018-03-22 NOTE — Patient Instructions (Signed)
Before your next abdominal ultrasound:  Take two Extra-Strength Gas-X capsules at bedtime the night before the test. Take another two Extra-Strength Gas-X capsules 3 hours before the test.  Avoid gas forming foods and beverages the day before the test.       Abdominal Aortic Aneurysm Blood pumps away from the heart through tubes (blood vessels) called arteries. Aneurysms are weak or damaged places in the wall of an artery. It bulges out like a balloon. An abdominal aortic aneurysm happens in the main artery of the body (aorta). It can burst or tear, causing bleeding inside the body. This is an emergency. It needs treatment right away. What are the causes? The exact cause is unknown. Things that could cause this problem include:  Fat and other substances building up in the lining of a tube.  Swelling of the walls of a blood vessel.  Certain tissue diseases.  Belly (abdominal) trauma.  An infection in the main artery of the body.  What increases the risk? There are things that make it more likely for you to have an aneurysm. These include:  Being over the age of 73 years old.  Having high blood pressure (hypertension).  Being a male.  Being white.  Being very overweight (obese).  Having a family history of aneurysm.  Using tobacco products.  What are the signs or symptoms? Symptoms depend on the size of the aneurysm and how fast it grows. There may not be symptoms. If symptoms occur, they can include:  Pain (belly, side, lower back, or groin).  Feeling full after eating a small amount of food.  Feeling sick to your stomach (nauseous), throwing up (vomiting), or both.  Feeling a lump in your belly that feels like it is beating (pulsating).  Feeling like you will pass out (faint).  How is this treated?  Medicine to control blood pressure and pain.  Imaging tests to see if the aneurysm gets bigger.  Surgery. How is this prevented? To lessen your chance of  getting this condition:  Stop smoking. Stop chewing tobacco.  Limit or avoid alcohol.  Keep your blood pressure, blood sugar, and cholesterol within normal limits.  Eat less salt.  Eat foods low in saturated fats and cholesterol. These are found in animal and whole dairy products.  Eat more fiber. Fiber is found in whole grains, vegetables, and fruits.  Keep a healthy weight.  Stay active and exercise often.  This information is not intended to replace advice given to you by your health care provider. Make sure you discuss any questions you have with your health care provider. Document Released: 01/21/2013 Document Revised: 03/03/2016 Document Reviewed: 10/26/2012 Elsevier Interactive Patient Education  2017 Elsevier Inc.     Peripheral Vascular Disease Peripheral vascular disease (PVD) is a disease of the blood vessels that are not part of your heart and brain. A simple term for PVD is poor circulation. In most cases, PVD narrows the blood vessels that carry blood from your heart to the rest of your body. This can result in a decreased supply of blood to your arms, legs, and internal organs, like your stomach or kidneys. However, it most often affects a person's lower legs and feet. There are two types of PVD.  Organic PVD. This is the more common type. It is caused by damage to the structure of blood vessels.  Functional PVD. This is caused by conditions that make blood vessels contract and tighten (spasm).  Without treatment, PVD tends to get   worse over time. PVD can also lead to acute ischemic limb. This is when an arm or limb suddenly has trouble getting enough blood. This is a medical emergency. Follow these instructions at home:  Take medicines only as told by your doctor.  Do not use any tobacco products, including cigarettes, chewing tobacco, or electronic cigarettes. If you need help quitting, ask your doctor.  Lose weight if you are overweight, and maintain a  healthy weight as told by your doctor.  Eat a diet that is low in fat and cholesterol. If you need help, ask your doctor.  Exercise regularly. Ask your doctor for some good activities for you.  Take good care of your feet. ? Wear comfortable shoes that fit well. ? Check your feet often for any cuts or sores. Contact a doctor if:  You have cramps in your legs while walking.  You have leg pain when you are at rest.  You have coldness in a leg or foot.  Your skin changes.  You are unable to get or have an erection (erectile dysfunction).  You have cuts or sores on your feet that are not healing. Get help right away if:  Your arm or leg turns cold and blue.  Your arms or legs become red, warm, swollen, painful, or numb.  You have chest pain or trouble breathing.  You suddenly have weakness in your face, arm, or leg.  You become very confused or you cannot speak.  You suddenly have a very bad headache.  You suddenly cannot see. This information is not intended to replace advice given to you by your health care provider. Make sure you discuss any questions you have with your health care provider. Document Released: 12/21/2009 Document Revised: 03/03/2016 Document Reviewed: 03/06/2014 Elsevier Interactive Patient Education  2017 Elsevier Inc.  

## 2018-03-22 NOTE — Progress Notes (Signed)
[facility] [place] HISTORY AND PHYSICAL   CC: Follow up peripheral artery occlusive disease and AAA and PAD   History of Present Illness:   Levi Mcconnell is a 72 y.o. male who is status post left femoral to posterior tibial artery bypass and endarterectomy of left tibioperoneal trunk in October 2010 by Dr. Oneida Alar.  We are also monitoring a moderate sized AAA.  He denies an back pain or abdominal pain.   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  He denies DVT history.   He denies any history of TIA or stroke.  Pt. denies claudication type symptoms in his legs with walking.  He denies non healing wounds.   He denies any cardiac problems.  A small AAA was found incidentally as part of liver evaluation, no liver abnormalities found; pt states pt states that his PCP is requesting the serial Duplex monitoring. He has been working with the [place] re his anxiety and PTSD.  Dr Oneida Alar last evaluated pt on 09-21-17. At that time abdominal aortic duplex showed the aneurysm diameter at 3.7-3.9 cm. This was essentially unchanged from 2017.  Of note there was a 5 cm mixed echogenicity lesion in the liver previously this has been documented on CT scan to be consistent with hemangioma. The patient continued to refrain from smoking. Dr. Oneida Alar encouraged him to walk more. Repeat his ABIs in 6 months. Abdominal aortic aneurysm 3.9 cm diameter would consider repair at 5.5 cm in diameter, follow-up ultrasound in 6 months.  He has mild left facial asymmetry due to facial reconstruction after skin cancer was excised from his face.  Pt states his blood pressure is usually about 130/80, and also he has not had his coffee yet this mornign whch he misses.   Diabetic: No Tobacco use: former smoker, quit in 2009  Pt meds include:  Statin :Yes, rosuvastatin  ASA: Yes Other anticoagulants/antiplatelets:  no   Past Medical History:  Diagnosis Date  . Cancer (HCC)    squamous cell carcinoma  . Edema leg   . Hyperlipidemia   . Hypertension   . Peripheral vascular disease [facility])     Social History Social History   Tobacco Use  . Smoking status: Former Smoker    Types: Cigarettes    Last attempt to quit: 07/21/2009    Years since quitting: 8.6  . Smokeless tobacco: Never Used  Substance Use Topics  . Alcohol use: Yes    Alcohol/week: 0.0 oz    Comment: rarely  . Drug use: No    Family History Family History  Problem Relation Age of Onset  . Other Father        thoracic aneurysm  . Heart disease Father        AAA-   After age 73  . Hyperlipidemia Mother   . Hypertension Mother   . Stroke Mother        Massive  . Heart attack Son   . Hyperlipidemia Son   . Hypertension Son   . Heart disease Son     Surgical History Past Surgical History:  Procedure Laterality Date  . CAROTID ENDARTERECTOMY    . COLONOSCOPY     July 2016  . PR VEIN BYPASS GRAFT,AORTO-FEM-POP  07/2009   left femoral to posterior tibial artery bypass  . PROSTATE BIOPSY      Allergies  Allergen Reactions  . Effexor [Venlafaxine]  Nausea And Vomiting    Current Outpatient Medications  Medication Sig Dispense Refill  . ALPRAZolam (XANAX) 1 MG tablet Take 1 mg by mouth 3 (three) times daily as needed.     Marland Kitchen amLODipine (NORVASC) 2.5 MG tablet Take 2.5 mg by mouth daily.    Marland Kitchen aspirin 81 MG tablet Take 81 mg by mouth daily.      . beta carotene w/minerals (OCUVITE) tablet Take 1 tablet by mouth daily. Monday- Wednesday- Friday    . Bisacodyl (DULCOLAX PO) Take by mouth.      . Brexpiprazole (REXULTI PO) Take 1 mg by mouth daily.     . Cyanocobalamin (VITAMIN B-12 PO) Take 2,000 mg by mouth daily.     Marland Kitchen docusate sodium (STOOL SOFTENER) 100 MG capsule Take 100 mg by mouth daily.    Marland Kitchen donepezil (ARICEPT) 5 MG tablet Take 10 mg by mouth at bedtime.     Marland Kitchen guaiFENesin (MUCINEX) 600 MG 12 hr tablet Take  600 mg by mouth as needed.    Marland Kitchen ibuprofen (ADVIL,MOTRIN) 200 MG tablet Take 200 mg by mouth as needed.    . Multiple Vitamin (MULTIVITAMIN) tablet Take 1 tablet by mouth daily.    . psyllium (METAMUCIL) 58.6 % powder Take 1 packet by mouth 2 (two) times daily. Tablets bid    . Rosuvastatin Calcium (CRESTOR PO) Take by mouth daily.    . sertraline (ZOLOFT) 100 MG tablet Take 100 mg by mouth every morning.      No current facility-administered medications for this visit.      REVIEW OF SYSTEMS: See HPI for pertinent positives and negatives.  Physical Examination Vitals:   03/22/18 0914 03/22/18 0929  BP: (!) 156/100 (!) 158/99  Pulse: (!) 50 (!) 55  Resp: 16   Temp: 97.7 F (36.5 C)   TempSrc: Oral   SpO2: 96% 95%  Weight: 205 lb (93 kg)   Height: 5\' 10"  (1.778 m)    Body mass index is 29.41 kg/m.  General:  WDWN in NAD Gait: Normal HENT: WNL, mild left facial asymmetry  Eyes: Pupils equal Pulmonary: normal non-labored breathing, good air movement in all fields CTAB, no rales, rhonchi,  or wheezing Cardiac: Regular rhythm, bradycardic, no murmur detected Abdomen: soft, NT, no masses palpated Skin: no rashes, no ulcers, no cellulitis.   VASCULAR EXAM  Carotid Bruits Right Left   Negative Negative      Radial pulses are 2+ palpable bilaterally   Adominal aortic pulse is not palpable                      VASCULAR EXAM: Extremities without ischemic changes, without Gangrene; without open wounds.                                                                                                          LE Pulses Right Left       FEMORAL  2+ palpable  2+ palpable        POPLITEAL  3+ palpable   3+ palpable  POSTERIOR TIBIAL  2+ palpable   2+ palpable        DORSALIS PEDIS      ANTERIOR TIBIAL not palpable  1+ palpable     Musculoskeletal: no muscle wasting or atrophy; no peripheral edema  Neurologic:  A&O X 3; appropriate affect, sensation is normal; speech  is normal, CN 2-12 intact, pain and light touch intact in extremities, motor exam as listed above. Psychiatric: Normal thought content, mood appropriate to clinical situation.      ASSESSMENT:  Levi Mcconnell is a 72 y.o. male who is status post left femoral to posterior tibial artery bypass and endarterectomy of left tibioperoneal trunk in October 2010. We are also monitoring a small AAA. He has no claudication symptoms with walking, no signs of ischemia in his feet/legs. He has no back pain nor abdominal pain.  Dr. Oneida Alar last evaluated pt on 08-26-15. At that time the pt was doing well with patent bypass to the left leg. He was asymptomatic. The bypass was patent. He had a small abdominal aortic aneurysm. We also need to consider doing a CT scan of his chest at some point in the future to rule out thoracic aneurysm. CTA chest was done on 09-07-17, ascending thoracic aorta measured 4 cm.   I advised him to check his blood pressure when he gets home; if >150/80 to notify his PCP.    DATA  AAA Duplex : Previous: (date: 09-21-17): 3.9 cm Current : (date: 03-22-18): 4.1 cm, Right CIA: 1.1 cm, Left CIA: 1.1 cm, all biphasic waveforms.    ABI (Date: 03/22/2018):  R:   ABI: 1.1 (was 1.03 on 09-21-17),   PT: tri  DP: bi  TBI:  0.80 (was 0.80)  L:   ABI: 1.09 (was 1.09),   PT: tri  DP: bi  TBI: 0.83 (was 0.83) Stable and normal bilateral ABI and TBI with tri and biphasic waveforms.     CTA chest 09-07-17: IMPRESSION: Vascular Impression:  1. Focal saccular aneurysmal dilatation of the left side of the proximal aortic arch measuring approximately 3.9 cm in diameter. No associated mural thrombus, perivascular stranding or dissection. 2. 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 Aortic aneurysm NOS (ICD10-I71.9). Recommend annual imaging followup by CTA or MRA. This recommendation follows 2010  ACCF/AHA/AATS/ACR/ASA/SCA/SCAI/SIR/STS/SVM Guidelines for the Diagnosis and Management of Patients with Thoracic Aortic Disease. Circulation. 2010; 121: P710-G269 3. 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 4. Aortic Atherosclerosis (ICD10-I70.0). Nonvascular Impression:  1. Mild centrilobular emphysematous change. Emphysema (ICD10-J43.9). 2. Punctate (approximately 0.5 cm) nodules/perifissural lymph node with the right middle lobe. Follow-up chest CT in 12 months is recommended to ensure stability. This recommendation follows the consensus statement: Guidelines for Management of Incidental Pulmonary Nodules Detected on CT Images: From the Fleischner Society 2017; Radiology 2017; 284:228-243. 3. Previously characterized hepatic hemangiomas appear morphologically similar to the 06/2014 abdominal MRI    PLAN:   Based on today's exam and non-invasive vascular lab results, the patient will follow up in 1 year with the following tests: AAA duplex, bilateral popliteal artery duplex, and ABI's. Follow up CTA chest to follow ascending aorta dialtaion in 2-3 years.   I discussed in depth with the patient the nature of atherosclerosis, and emphasized the importance of maximal medical management including strict control of blood pressure, blood glucose, and  lipid levels, obtaining regular exercise, and cessation of smoking.  The patient is aware that without maximal medical management the underlying atherosclerotic disease process will progress, limiting the benefit of any interventions.  Consideration for repair of AAA would be made when the size approaches 5.0 cm, growth > 1 cm/yr, and symptomatic status. The patient was given information about AAA including signs, symptoms, treatment,  what symptoms should prompt the patient to seek immediate medical  care, and how to minimize the risk of enlargement and rupture of aneurysms.   The patient was given information about PAD including signs, symptoms, treatment, what symptoms should prompt the patient to seek immediate medical care, and risk reduction measures to take.  Thank you for allowing [place] to participate in this patient's care.  Clemon Chambers, RN, MSN, FNP-C [facility] Office: [phone]  Clinic MD: [facility] 03/22/2018 9:47 AM

## 2019-05-13 ENCOUNTER — Telehealth: Payer: Self-pay | Admitting: *Deleted

## 2019-05-13 NOTE — Telephone Encounter (Signed)
Virtual Visit Pre-Appointment Phone Call  Today, I spoke with Levi Mcconnell spouse Levi Mcconnell (Spouse) and performed the following actions:  1. I explained that we are currently trying to limit exposure to the COVID-19 virus by seeing patients at home rather than in the office.  I explained that the visits are best done by video, but can be done by telephone.  I asked the patient if a virtual visit that the patient would like to try instead of coming into the office. Levi Mcconnell agreed to proceed with the virtual visit scheduled with Levi Mcconnell on 05/17/19.     2. I confirmed the BEST phone number to call the day of the visit and- I included this in appointment notes.  3. I asked if the patient had access to (through a family member/friend) a smartphone with video capability to be used for his visit?"  The patient said yes -    4. I confirmed consent by  a. sending through [place] or by email the [place] as written at the end of this message or  b. verbally as listed below. i. This visit is being performed in the setting of COVID-19. ii. All virtual visits are billed to your insurance company just like a normal visit would be.   iii. We'd like you to understand that the technology does not allow for your provider to perform an examination, and thus may limit your provider's ability to fully assess your condition.  iv. If your provider identifies any concerns that need to be evaluated in person, we will make arrangements to do so.   v. Finally, though the technology is pretty good, we cannot assure that it will always work on either your or our end, and in the setting of a video visit, we may have to convert it to a phone-only visit.  In either situation, we cannot ensure that we have a secure connection.   vi. Are you willing to proceed?"  STAFF: Did the patient verbally acknowledge consent to telehealth visit? Document YES/NO here:  YES  2. I advised the patient to be prepared - I asked that the patient, on the day of his visit, record any information possible with the equipment at his home, such as blood pressure, pulse, oxygen saturation, and your weight and write them all down. I asked the patient to have a pen and paper handy nearby the day of the visit as well.  3. If the patient was scheduled for a video visit, I informed the patient that the visit with the doctor would start with a text to the smartphone # given to [place] by the patient.         If the patient was scheduled for a telephone call, I informed the patient that the visit with the doctor would start with a call to the telephone # given to [place] by the patient.  4. I Informed patient they will receive a phone call 15 minutes prior to their appointment time from a [place] or nurse to review medications, allergies, etc. to prepare for the visit.    TELEPHONE CALL NOTE  Levi Mcconnell has been deemed a candidate for a follow-up tele-health visit to limit community exposure during the Covid-19 pandemic. I spoke with the patient via phone to ensure availability of phone/video source, confirm preferred email & phone number, and discuss instructions and expectations.  I reminded Levi Mcconnell to be prepared  with any vital sign and/or heart rhythm information that could potentially be obtained via home monitoring, at the time of his visit. I reminded Levi Mcconnell to expect a phone call prior to his visit.  Levi Mcconnell, NT 05/13/2019 2:43 PM     FULL LENGTH CONSENT FOR TELE-HEALTH VISIT   I hereby voluntarily request, consent and authorize [facility] and its employed or contracted physicians, physician assistants, nurse practitioners or other licensed health care professionals (the Practitioner), to provide me with telemedicine health care services (the "Services") as deemed necessary by the treating Practitioner. I acknowledge and consent to receive the  Services by the Practitioner via telemedicine. I understand that the telemedicine visit will involve communicating with the Practitioner through live audiovisual communication technology and the disclosure of certain medical information by electronic transmission. I acknowledge that I have been given the opportunity to request an in-person assessment or other available alternative prior to the telemedicine visit and am voluntarily participating in the telemedicine visit.  I understand that I have the right to withhold or withdraw my consent to the use of telemedicine in the course of my care at any time, without affecting my right to future care or treatment, and that the Practitioner or I may terminate the telemedicine visit at any time. I understand that I have the right to inspect all information obtained and/or recorded in the course of the telemedicine visit and may receive copies of available information for a reasonable fee.  I understand that some of the potential risks of receiving the Services via telemedicine include:  Marland Kitchen Delay or interruption in medical evaluation due to technological equipment failure or disruption; . Information transmitted may not be sufficient (e.g. poor resolution of images) to allow for appropriate medical decision making by the Practitioner; and/or  . In rare instances, security protocols could fail, causing a breach of personal health information.  Furthermore, I acknowledge that it is my responsibility to provide information about my medical history, conditions and care that is complete and accurate to the best of my ability. I acknowledge that Practitioner's advice, recommendations, and/or decision may be based on factors not within their control, such as incomplete or inaccurate data provided by me or distortions of diagnostic images or specimens that may result from electronic transmissions. I understand that the practice of medicine is not an exact science and that  Practitioner makes no warranties or guarantees regarding treatment outcomes. I acknowledge that I will receive a copy of this consent concurrently upon execution via email to the email address I last provided but may also request a printed copy by calling the office of [place].    I understand that my insurance will be billed for this visit.   I have read or had this consent read to me. . I understand the contents of this consent, which adequately explains the benefits and risks of the Services being provided via telemedicine.  . I have been provided ample opportunity to ask questions regarding this consent and the Services and have had my questions answered to my satisfaction. . I give my informed consent for the services to be provided through the use of telemedicine in my medical care  By participating in this telemedicine visit I agree to the above.

## 2019-05-14 ENCOUNTER — Other Ambulatory Visit: Payer: Self-pay

## 2019-05-14 DIAGNOSIS — I714 Abdominal aortic aneurysm, without rupture, unspecified: Secondary | ICD-10-CM

## 2019-05-14 DIAGNOSIS — I779 Disorder of arteries and arterioles, unspecified: Secondary | ICD-10-CM

## 2019-05-16 ENCOUNTER — Ambulatory Visit (INDEPENDENT_AMBULATORY_CARE_PROVIDER_SITE_OTHER)
Admission: RE | Admit: 2019-05-16 | Discharge: 2019-05-16 | Disposition: A | Discharge status: Home / Self Care | Payer: Medicare Other | Source: Ambulatory Visit | Attending: Family | Admitting: Family

## 2019-05-16 ENCOUNTER — Ambulatory Visit (HOSPITAL_COMMUNITY)
Admission: RE | Admit: 2019-05-16 | Discharge: 2019-05-16 | Disposition: A | Discharge status: Home / Self Care | Payer: Medicare Other | Source: Ambulatory Visit | Attending: Family | Admitting: Family

## 2019-05-16 ENCOUNTER — Other Ambulatory Visit: Payer: Self-pay

## 2019-05-16 DIAGNOSIS — I779 Disorder of arteries and arterioles, unspecified: Secondary | ICD-10-CM

## 2019-05-16 DIAGNOSIS — I714 Abdominal aortic aneurysm, without rupture, unspecified: Secondary | ICD-10-CM

## 2019-05-17 ENCOUNTER — Encounter: Payer: Self-pay | Admitting: Family

## 2019-05-17 ENCOUNTER — Ambulatory Visit (INDEPENDENT_AMBULATORY_CARE_PROVIDER_SITE_OTHER): Payer: Medicare Other | Admitting: Family

## 2019-05-17 DIAGNOSIS — I714 Abdominal aortic aneurysm, without rupture, unspecified: Secondary | ICD-10-CM

## 2019-05-17 DIAGNOSIS — I779 Disorder of arteries and arterioles, unspecified: Secondary | ICD-10-CM

## 2019-05-17 NOTE — Progress Notes (Addendum)
Virtual Visit via Telephone Note   I connected with Levi Mcconnell on 05/17/2019 using the [employer] by telephone and verified that I was speaking with the correct person using two identifiers. Patient was located at his home and accompanied by his wife. I am located at the [facility] office/clinic.   The limitations of evaluation and management by telemedicine and the availability of in person appointments have been previously discussed with the patient and are documented in the patients chart. The patient expressed understanding and consented to proceed.  PCP: Irven Shelling, MD  Chief Complaint: Follow up peripheral artery occlusive disease and AAA and PAD  History of Present Illness: Levi Mcconnell is a 73 y.o. male who is status post left femoral to posterior tibial artery bypass and endarterectomy of left tibioperoneal trunk in October 2010 by Dr. Oneida Alar. We are also monitoring a moderate sized AAA.  He denies an back pain or abdominal pain.   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  He denies DVT history.   He denies any history of TIA or stroke.  Pt. denies claudication type symptomsin hislegswith walking. He denies non healing wounds.   He denies any cardiac problems.  A small AAA was found incidentally as part of liver evaluation, no liver abnormalities found; pt states pt states that his PCP is requesting the serial Duplex monitoring. He has been working with the [place] re his anxiety and PTSD.  Dr Oneida Alar last evaluated pt on 09-21-17. At that time abdominal aortic duplex showed the aneurysm diameter at 3.7-3.9 cm. This was essentially unchanged from 2017. Of note there was a 5 cm mixed echogenicity lesion in the liver previously this has been documented on CT scan to be consistent with hemangioma. The patient continued to refrain from smoking. Dr. Oneida Alar encouraged him to walk more. Repeat his  ABIs in 6 months. Abdominal aortic aneurysm 3.9 cm diameter would consider repair at 5.5 cm in diameter, follow-up ultrasound in 6 months.  He has mild left facial asymmetry due to facial reconstruction after skin cancer was excised from his face.  Pt states his blood pressure is usually about 130/80, and also he has not had his coffee yet this mornign whch he misses.   Diabetic: No Tobacco use: former smoker, quit in 2009  Pt meds include:  Statin :Yes, rosuvastatin ASA: Yes Other anticoagulants/antiplatelets: no   Past Medical History:  Diagnosis Date  . Cancer (HCC)    squamous cell carcinoma  . Edema leg   . Hyperlipidemia   . Hypertension   . Peripheral vascular disease [facility])     Past Surgical History:  Procedure Laterality Date  . CAROTID ENDARTERECTOMY    . COLONOSCOPY     July 2016  . PR VEIN BYPASS GRAFT,AORTO-FEM-POP  07/2009   left femoral to posterior tibial artery bypass  . PROSTATE BIOPSY      Current Meds  Medication Sig  . ALPRAZolam (XANAX) 1 MG tablet Take 1 mg by mouth 3 (three) times daily as needed.   Marland Kitchen amLODipine (NORVASC) 2.5 MG tablet Take 2.5 mg by mouth daily.  Marland Kitchen aspirin 81 MG tablet Take 81 mg by mouth daily.    . beta carotene w/minerals (OCUVITE) tablet Take 1 tablet by mouth daily. Monday- Wednesday- Friday  . Brexpiprazole (REXULTI PO) Take 1 mg by mouth daily.   . Cyanocobalamin (VITAMIN B-12 PO) Take 2,000 mg  by mouth daily.   Marland Kitchen docusate sodium (STOOL SOFTENER) 100 MG capsule Take 100 mg by mouth daily.  Marland Kitchen donepezil (ARICEPT) 5 MG tablet Take 10 mg by mouth at bedtime.   Marland Kitchen ibuprofen (ADVIL,MOTRIN) 200 MG tablet Take 200 mg by mouth as needed.  . Multiple Vitamin (MULTIVITAMIN) tablet Take 1 tablet by mouth daily.  . psyllium (METAMUCIL) 58.6 % powder Take 1 packet by mouth 2 (two) times daily. Tablets bid  . Rosuvastatin Calcium (CRESTOR PO) Take by mouth daily.  . sertraline (ZOLOFT) 100 MG tablet Take 100 mg by mouth every  morning.   . tadalafil (CIALIS) 20 MG tablet Take 20 mg by mouth as needed.   Past Surgical History:  Procedure Laterality Date  . CAROTID ENDARTERECTOMY    . COLONOSCOPY     July 2016  . PR VEIN BYPASS GRAFT,AORTO-FEM-POP  07/2009   left femoral to posterior tibial artery bypass  . PROSTATE BIOPSY     12 system ROS was negative unless otherwise noted in HPI   Observations/Objective:  DATA  AAA Duplex :  Previous: (date: 03-22-18): 4.1 cm, Right CIA: 1.1 cm, Left CIA: 1.1 cm, all biphasic waveforms.  Current (05-16-19): 4.2 cm    Stable AAA.   05-16-19: Left Graft #1: femoral to posterior tibial Summary: Left Graft(s): Femoral to posterior tibial bypass graft patent with no evidence of stenosis noted.  Inflow 111 triphasic Prox Anastomosis 114 triphasic Proximal Graft 52 triphasic Mid Graft 55 triphasic Distal Graft 45 triphasic Distal Anastomosis 97 triphasic Outflow 81 triphasic Summary: Left Graft(s): Femoral to posterior tibial bypass graft patent with no evidence of stenosis noted.   ABI (05-16-19): Right ATA 184 0.98 triphasic PTA 188 1.01 triphasic Great Toe 155 0.83 Left Lt Pressure (mmHg) Index Waveform Comment Brachial 187 ATA 190 1.02 triphasic PTA 191 1.02 triphasic Great Toe 157 0.84 ABI/TBI Today's ABI Today's TBI Previous ABI Previous TBI Right 1.01 0.83 1.1 0.8 Left 1.02 0.84 1.09 0.83 Summary: Right: Resting right ankle-brachial index is within normal range. No evidence of significant right lower extremity arterial disease. The right toe-brachial index is normal. RT great toe pressure = 155 mmHg. Left: Resting left ankle-brachial index is within normal range. No evidence of significant left lower extremity arterial disease. The left toe-brachial index is normal. LT Great toe pressure = 157 mmHg.   CTA chest 09-07-17: IMPRESSION: Vascular Impression:  1. Focal saccular aneurysmal dilatation of the left side of the proximal aortic arch  measuring approximately 3.9 cm in diameter. No associated mural thrombus, perivascular stranding or dissection. 2. 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 Aortic aneurysm NOS (ICD10-I71.9). Recommend annual imaging followup by CTA or MRA. This recommendation follows 2010 ACCF/AHA/AATS/ACR/ASA/SCA/SCAI/SIR/STS/SVM Guidelines for the Diagnosis and Management of Patients with Thoracic Aortic Disease. Circulation. 2010; 121: Q762-U633 3. 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 4. Aortic Atherosclerosis (ICD10-I70.0). Nonvascular Impression:  1. Mild centrilobular emphysematous change. Emphysema (ICD10-J43.9). 2. Punctate (approximately 0.5 cm) nodules/perifissural lymph node with the right middle lobe. Follow-up chest CT in 12 months is recommended to ensure stability. This recommendation follows the consensus statement: Guidelines for Management of Incidental Pulmonary Nodules Detected on CT Images: From the Fleischner Society 2017; Radiology 2017; 284:228-243. 3. Previously characterized hepatic hemangiomas appear morphologically similar to the 06/2014 abdominal MRI   Assessment and Plan: Levi [place]  BRAIDYN Mcconnell is a 73 y.o. male who is status post left femoral to posterior tibial artery bypass and endarterectomy of left tibioperoneal trunk in October 2010. We are also monitoring a small AAA. He has no claudication symptomswith walking, nosigns of ischemia in his feet/legs. He has no back pain nor abdominal pain.  AAA at 4.2 cm, 4.1 cm a year ago, stable  Bilateral ABI remain normal with all triphasic waveforms. Left LE arterial duplex: Femoral to posterior tibial bypass graft patent with no evidence of stenosis noted, all triphasic waveforms.  Recent CTA chest/abd/pelvis performed at  [facility].  I asked Dr. Oneida Alar to review the images and measure the diameters of the thoracic and abdominal aortas. Abdomen is 4.8 cm.  Ascending and arch if 4 cm.  Needs repeat CTA chest abdomen pelvis 6 months.   Dr. Oneida Alar last evaluated pt on 08-26-15. At that time the pt wasdoing well with patent bypass to the left leg. Hewas asymptomatic. The bypasswas patent. He hada small abdominal aortic aneurysm. CTA chest was done on 09-07-17, ascending thoracic aorta measured 4 cm.    PLAN:   Based on today's exam and non-invasive vascular lab results,the patient will follow up in1 yearwith the following tests: bilateral popliteal artery duplex, and ABI's.  Needs repeat CTA chest abdomen pelvis 6 months. Follow up CTA chest to follow ascending aorta dialtaion in 2-3 years (June 2021 or 2022).    I discussed the assessment and treatment plan with the patient. The patient was provided an opportunity to ask questions and all were answered. The patient agreed with the plan and demonstrated an understanding of the instructions.   The patient was advised to call back or seek an in-person evaluation if the symptoms worsen or if the condition fails to improve as anticipated.  I spent 17 minutes with the patient via telephone encounter.   Gabrielle Dare Jett Fukuda [facility] [place] Office: [phone]  05/17/2019, 5:39 PM

## 2019-11-01 ENCOUNTER — Other Ambulatory Visit: Payer: Self-pay

## 2019-11-01 DIAGNOSIS — I998 Other disorder of circulatory system: Secondary | ICD-10-CM

## 2019-11-27 ENCOUNTER — Other Ambulatory Visit: Payer: Self-pay

## 2019-11-27 DIAGNOSIS — I998 Other disorder of circulatory system: Secondary | ICD-10-CM

## 2019-11-28 ENCOUNTER — Ambulatory Visit: Payer: Medicare Other | Admitting: Vascular Surgery

## 2019-12-16 ENCOUNTER — Other Ambulatory Visit: Payer: Medicare Other

## 2019-12-17 ENCOUNTER — Ambulatory Visit
Admission: RE | Admit: 2019-12-17 | Discharge: 2019-12-17 | Disposition: A | Discharge status: Home / Self Care | Payer: Medicare Other | Source: Ambulatory Visit | Attending: Vascular Surgery | Admitting: Vascular Surgery

## 2019-12-17 ENCOUNTER — Other Ambulatory Visit: Payer: Self-pay

## 2019-12-17 DIAGNOSIS — I998 Other disorder of circulatory system: Secondary | ICD-10-CM

## 2019-12-17 MED ORDER — IOPAMIDOL (ISOVUE-370) INJECTION 76%
75.0000 mL | Freq: Once | INTRAVENOUS | Status: AC | PRN
  Administered 2019-12-17: 75 mL via INTRAVENOUS

## 2019-12-19 ENCOUNTER — Ambulatory Visit (INDEPENDENT_AMBULATORY_CARE_PROVIDER_SITE_OTHER): Payer: Medicare Other | Admitting: Vascular Surgery

## 2019-12-19 ENCOUNTER — Other Ambulatory Visit: Payer: Self-pay

## 2019-12-19 VITALS — BP 209/119 | HR 70 | Temp 98.4°F | Resp 20 | Ht 70.0 in | Wt 214.2 lb

## 2019-12-19 DIAGNOSIS — E041 Nontoxic single thyroid nodule: Secondary | ICD-10-CM | POA: Diagnosis not present

## 2019-12-19 DIAGNOSIS — N2889 Other specified disorders of kidney and ureter: Secondary | ICD-10-CM | POA: Diagnosis not present

## 2019-12-19 DIAGNOSIS — I739 Peripheral vascular disease, unspecified: Secondary | ICD-10-CM

## 2019-12-19 DIAGNOSIS — I714 Abdominal aortic aneurysm, without rupture, unspecified: Secondary | ICD-10-CM

## 2019-12-19 NOTE — Progress Notes (Signed)
Patient name: Levi Mcconnell MRN: [id number] DOB: 03/29/1946 Sex: male  HPI: Levi Mcconnell is a 74 y.o. male, who is status post left femoral to posterior tibial artery bypass in 2010.  He has not had any claudication or rest pain symptoms.  He stopped smoking after his operation.  He is on aspirin and a statin.  He has no abdominal or back pain.  Other medical problems include hyperlipidemia and hypertension.  These are both currently stable.  His blood pressure tends to be elevated when he comes to our office.  Past Medical History:  Diagnosis Date  . Cancer (HCC)    squamous cell carcinoma  . Edema leg   . Hyperlipidemia   . Hypertension   . Peripheral vascular disease [facility])    Past Surgical History:  Procedure Laterality Date  . CAROTID ENDARTERECTOMY    . COLONOSCOPY     July 2016  . PR VEIN BYPASS GRAFT,AORTO-FEM-POP  07/2009   left femoral to posterior tibial artery bypass  . PROSTATE BIOPSY      Family History  Problem Relation Age of Onset  . Other Father        thoracic aneurysm  . Heart disease Father        AAA-   After age 34  . Hyperlipidemia Mother   . Hypertension Mother   . Stroke Mother        Massive  . Heart attack Son   . Hyperlipidemia Son   . Hypertension Son   . Heart disease Son     SOCIAL HISTORY: Social History   Socioeconomic History  . Marital status: Married    Spouse name: Not on file  . Number of children: Not on file  . Years of education: Not on file  . Highest education level: Not on file  Occupational History  . Not on file  Tobacco Use  . Smoking status: Former Smoker    Types: Cigarettes    Quit date: 07/21/2009    Years since quitting: 10.4  . Smokeless tobacco: Never Used  Substance and Sexual Activity  . Alcohol use: Yes    Alcohol/week: 0.0 standard drinks    Comment: rarely  . Drug use: No  . Sexual activity: Not on file  Other Topics Concern  . Not on file  Social History Narrative  . Not on file    Social Determinants of Health   Financial Resource Strain:   . Difficulty of Paying Living Expenses:   Food Insecurity:   . Worried About Charity fundraiser in the Last Year:   . Arboriculturist in the Last Year:   Transportation Needs:   . Film/video editor (Medical):   Marland Kitchen Lack of Transportation (Non-Medical):   Physical Activity:   . Days of Exercise per Week:   . Minutes of Exercise per Session:   Stress:   . Feeling of Stress :   Social Connections:   . Frequency of Communication with Friends and Family:   . Frequency of Social Gatherings with Friends and Family:   . Attends Religious Services:   . Active Member of Clubs or Organizations:   . Attends Archivist Meetings:   Marland Kitchen Marital Status:   Intimate Partner Violence:   . Fear of Current or Ex-Partner:   . Emotionally Abused:   Marland Kitchen Physically Abused:   . Sexually Abused:     Allergies  Allergen Reactions  . Effexor [Venlafaxine] Nausea And Vomiting  Current Outpatient Medications  Medication Sig Dispense Refill  . ALPRAZolam (XANAX) 1 MG tablet Take 1 mg by mouth 3 (three) times daily as needed.     Marland Kitchen amLODipine (NORVASC) 2.5 MG tablet Take 2.5 mg by mouth daily.    Marland Kitchen aspirin 81 MG tablet Take 81 mg by mouth daily.      . beta carotene w/minerals (OCUVITE) tablet Take 1 tablet by mouth daily. Monday- Wednesday- Friday    . Bisacodyl (DULCOLAX PO) Take by mouth.      . Brexpiprazole (REXULTI PO) Take 1 mg by mouth daily.     . Cyanocobalamin (VITAMIN B-12 PO) Take 2,000 mg by mouth daily.     Marland Kitchen docusate sodium (STOOL SOFTENER) 100 MG capsule Take 100 mg by mouth daily.    Marland Kitchen donepezil (ARICEPT) 5 MG tablet Take 10 mg by mouth at bedtime.     Marland Kitchen ibuprofen (ADVIL,MOTRIN) 200 MG tablet Take 200 mg by mouth as needed.    . Multiple Vitamin (MULTIVITAMIN) tablet Take 1 tablet by mouth daily.    . psyllium (METAMUCIL) 58.6 % powder Take 1 packet by mouth 2 (two) times daily. Tablets bid    . Rosuvastatin  Calcium (CRESTOR PO) Take by mouth daily.    . sertraline (ZOLOFT) 100 MG tablet Take 100 mg by mouth every morning.     Marland Kitchen guaiFENesin (MUCINEX) 600 MG 12 hr tablet Take 600 mg by mouth as needed.    . tadalafil (CIALIS) 20 MG tablet Take 20 mg by mouth as needed.     No current facility-administered medications for this visit.    ROS:   General:  No weight loss, Fever, chills  HEENT: No recent headaches, no nasal bleeding, no visual changes, no sore throat  Neurologic: No dizziness, blackouts, seizures. No recent symptoms of stroke or mini- stroke. No recent episodes of slurred speech, or temporary blindness.  Cardiac: No recent episodes of chest pain/pressure, no shortness of breath at rest.  No shortness of breath with exertion.  Denies history of atrial fibrillation or irregular heartbeat  Vascular: No history of rest pain in feet.  No history of claudication.  No history of non-healing ulcer, No history of DVT   Pulmonary: No home oxygen, no productive cough, no hemoptysis,  No asthma or wheezing  Musculoskeletal:  [ ]  Arthritis, [ ]  Low back pain,  [ ]  Joint pain  Hematologic:No history of hypercoagulable state.  No history of easy bleeding.  No history of anemia  Gastrointestinal: No hematochezia or melena,  No gastroesophageal reflux, no trouble swallowing  Urinary: [ ]  chronic Kidney disease, [ ]  on HD - [ ]  MWF or [ ]  TTHS, [ ]  Burning with urination, [ ]  Frequent urination, [ ]  Difficulty urinating;   Skin: No rashes  Psychological: No history of anxiety,  No history of depression   Physical Examination  Vitals:   12/19/19 1057 12/19/19 1102  BP: (!) 196/116 (!) 209/119  Pulse: 70   Resp: 20   Temp: 98.4 F (36.9 C)   SpO2: 95%   Weight: 214 lb 3.2 oz (97.2 kg)   Height: 5\' 10"  (1.778 m)     Body mass index is 30.73 kg/m.  General:  Alert and oriented, no acute distress HEENT: Normal Neck: No bruit or JVD Pulmonary: Clear to auscultation  bilaterally Cardiac: Regular Rate and Rhythm without murmur Abdomen: Soft, non-tender, non-distended, no mass, no scars Skin: No rash Extremity Pulses:  2+ radial, brachial, femoral, dorsalis  pedis, posterior tibial pulses bilaterally Musculoskeletal: No deformity or edema  Neurologic: Upper and lower extremity motor 5/5 and symmetric  DATA:  Patient had a CT angiogram December 17, 2019 and I reviewed these images today and interpreted the results.  His abdominal aortic aneurysm was 4.9 cm in diameter.  The proximal aspect of his left femoral tibial bypass graft was patent.  There were multiple other nonvascular findings.  1.  There was a left thyroid nodule that will need ultrasound for follow-up  2.  There was a left renal mass which will need MRI with and without contrast for follow-up  ASSESSMENT: 1.  Abdominal aortic aneurysm 4.9 cm diameter continue to observe unless grows to more than 5.5 cm.  He will have a follow-up ultrasound of his aorta in 6 months time.  Peripheral arterial disease status post left femoral posterior tibial artery bypass patent bypass graft no claudication symptoms needs ABIs and graft duplex scan in 6 months.  3.  Thyroid nodule asymptomatic needs ultrasound of thyroid for left lobe nodule.  We will set this up for him.  4.  Left renal mass needs MRI with and without contrast and an office visit with urology for further evaluation of this.  I discussed with the patient today that this may be a renal cancer.  He understands.   PLAN: See above   Ruta Hinds, MD [facility] [place] Office: [phone] Pager: [phone]

## 2019-12-20 ENCOUNTER — Other Ambulatory Visit: Payer: Self-pay

## 2019-12-20 DIAGNOSIS — I998 Other disorder of circulatory system: Secondary | ICD-10-CM

## 2019-12-20 DIAGNOSIS — N2889 Other specified disorders of kidney and ureter: Secondary | ICD-10-CM

## 2019-12-24 ENCOUNTER — Other Ambulatory Visit: Payer: Self-pay | Admitting: *Deleted

## 2019-12-24 DIAGNOSIS — I779 Disorder of arteries and arterioles, unspecified: Secondary | ICD-10-CM

## 2019-12-24 DIAGNOSIS — I739 Peripheral vascular disease, unspecified: Secondary | ICD-10-CM

## 2019-12-24 DIAGNOSIS — I714 Abdominal aortic aneurysm, without rupture, unspecified: Secondary | ICD-10-CM

## 2020-01-01 ENCOUNTER — Ambulatory Visit
Admission: RE | Admit: 2020-01-01 | Discharge: 2020-01-01 | Disposition: A | Discharge status: Home / Self Care | Payer: Medicare Other | Source: Ambulatory Visit | Attending: Vascular Surgery | Admitting: Vascular Surgery

## 2020-01-01 ENCOUNTER — Other Ambulatory Visit: Payer: Self-pay

## 2020-01-01 DIAGNOSIS — N2889 Other specified disorders of kidney and ureter: Secondary | ICD-10-CM

## 2020-01-01 DIAGNOSIS — I998 Other disorder of circulatory system: Secondary | ICD-10-CM

## 2020-01-01 MED ORDER — GADOBENATE DIMEGLUMINE 529 MG/ML IV SOLN
20.0000 mL | Freq: Once | INTRAVENOUS | Status: AC | PRN
  Administered 2020-01-01: 20 mL via INTRAVENOUS

## 2020-01-02 ENCOUNTER — Other Ambulatory Visit: Payer: Self-pay

## 2020-01-02 DIAGNOSIS — E041 Nontoxic single thyroid nodule: Secondary | ICD-10-CM

## 2020-01-08 ENCOUNTER — Other Ambulatory Visit: Payer: Medicare Other

## 2020-01-08 ENCOUNTER — Ambulatory Visit
Admission: RE | Admit: 2020-01-08 | Discharge: 2020-01-08 | Disposition: A | Discharge status: Home / Self Care | Payer: Medicare Other | Source: Ambulatory Visit | Attending: Vascular Surgery | Admitting: Vascular Surgery

## 2020-01-08 DIAGNOSIS — E041 Nontoxic single thyroid nodule: Secondary | ICD-10-CM

## 2020-01-15 ENCOUNTER — Other Ambulatory Visit: Payer: Medicare Other

## 2020-03-03 DIAGNOSIS — G8918 Other acute postprocedural pain: Secondary | ICD-10-CM | POA: Insufficient documentation

## 2020-03-03 DIAGNOSIS — F32A Depression, unspecified: Secondary | ICD-10-CM | POA: Insufficient documentation

## 2020-03-03 DIAGNOSIS — N2889 Other specified disorders of kidney and ureter: Secondary | ICD-10-CM | POA: Insufficient documentation

## 2020-08-20 ENCOUNTER — Other Ambulatory Visit: Payer: Self-pay

## 2020-08-20 ENCOUNTER — Ambulatory Visit (INDEPENDENT_AMBULATORY_CARE_PROVIDER_SITE_OTHER)
Admission: RE | Admit: 2020-08-20 | Discharge: 2020-08-20 | Disposition: A | Discharge status: Home / Self Care | Payer: Medicare Other | Source: Ambulatory Visit | Attending: Vascular Surgery | Admitting: Vascular Surgery

## 2020-08-20 ENCOUNTER — Encounter: Payer: Self-pay | Admitting: Vascular Surgery

## 2020-08-20 ENCOUNTER — Ambulatory Visit (HOSPITAL_COMMUNITY)
Admission: RE | Admit: 2020-08-20 | Discharge: 2020-08-20 | Disposition: A | Discharge status: Home / Self Care | Payer: Medicare Other | Source: Ambulatory Visit | Attending: Vascular Surgery | Admitting: Vascular Surgery

## 2020-08-20 ENCOUNTER — Ambulatory Visit (INDEPENDENT_AMBULATORY_CARE_PROVIDER_SITE_OTHER): Payer: Medicare Other | Admitting: Vascular Surgery

## 2020-08-20 VITALS — BP 155/100 | HR 66 | Temp 98.0°F | Resp 20 | Ht 70.0 in | Wt 205.8 lb

## 2020-08-20 DIAGNOSIS — I714 Abdominal aortic aneurysm, without rupture, unspecified: Secondary | ICD-10-CM

## 2020-08-20 DIAGNOSIS — I739 Peripheral vascular disease, unspecified: Secondary | ICD-10-CM

## 2020-08-20 DIAGNOSIS — I779 Disorder of arteries and arterioles, unspecified: Secondary | ICD-10-CM

## 2020-08-20 NOTE — Progress Notes (Signed)
Patient is a 74 year old male who returns for follow-up today.  He was last seen March 2021.  He previously underwent left femoral to posterior tibial artery bypass in 2010.  He also has a known abdominal aortic aneurysm which last measured 4.9 cm in 2021.  He is currently on a statin and aspirin.  He recently had a cryo procedure for his renal mass on the left side.  He also has a known thyroid nodule and was to get follow-up on this in a year.  He states his primary MD has checked on this.  Past Medical History:  Diagnosis Date  . Cancer (HCC)    squamous cell carcinoma  . Edema leg   . Hyperlipidemia   . Hypertension   . Peripheral vascular disease [facility])     Past Surgical History:  Procedure Laterality Date  . CAROTID ENDARTERECTOMY    . COLONOSCOPY     July 2016  . PR VEIN BYPASS GRAFT,AORTO-FEM-POP  07/2009   left femoral to posterior tibial artery bypass  . PROSTATE BIOPSY      Current Outpatient Medications on File Prior to Visit  Medication Sig Dispense Refill  . amLODipine (NORVASC) 5 MG tablet Take 5 mg by mouth 2 (two) times daily.    Marland Kitchen aspirin 81 MG tablet Take 81 mg by mouth daily.      . beta carotene w/minerals (OCUVITE) tablet Take 1 tablet by mouth daily. Monday- Wednesday- Friday    . Bisacodyl (DULCOLAX PO) Take by mouth.      . Brexpiprazole (REXULTI PO) Take 1 mg by mouth daily.     Marland Kitchen docusate sodium (STOOL SOFTENER) 100 MG capsule Take 100 mg by mouth daily.    Marland Kitchen donepezil (ARICEPT) 5 MG tablet Take 10 mg by mouth at bedtime.     . furosemide (LASIX) 20 MG tablet Take 20 mg by mouth daily.    Marland Kitchen guaiFENesin (MUCINEX) 600 MG 12 hr tablet Take 600 mg by mouth as needed.    Marland Kitchen LORazepam (ATIVAN) 1 MG tablet Take 1 mg by mouth 3 (three) times daily.    . Multiple Vitamin (MULTIVITAMIN) tablet Take 1 tablet by mouth daily.    . psyllium (METAMUCIL) 58.6 % powder Take 1 packet by mouth 2 (two) times daily. Tablets bid    . rosuvastatin (CRESTOR) 5 MG tablet Take by  mouth.    . sertraline (ZOLOFT) 100 MG tablet Take 1.5 tablets by mouth daily.    . vitamin B-12 (CYANOCOBALAMIN) 500 MCG tablet Take by mouth.    . tadalafil (CIALIS) 20 MG tablet Take 20 mg by mouth as needed. (Patient not taking: Reported on 08/20/2020)     No current facility-administered medications on file prior to visit.   Physical exam:  Vitals:   08/20/20 1008  BP: (!) 155/100  Pulse: 66  Resp: 20  Temp: 98 F (36.7 C)  SpO2: 95%  Weight: 205 lb 12.8 oz (93.4 kg)  Height: 5\' 10"  (1.778 m)    Extremities: 2+ right posterior tibial pulse 1+ left posterior tibial pulse, 2+ femoral pulses  Abdomen: Soft nontender vaguely palpable pulsatile mass  Data: Patient had a left leg graft duplex scan today which showed a patent left femoral to posterior tibial artery bypass.  He had bilateral ABIs performed which showed right side 1.03 left 0.99.  He also had an ultrasound of his abdominal aorta which showed aortic diameter 5.2 cm.  This is compared to a diameter of 4.9 cm on  the CT angiogram from March 2021.  Assessment: 1.  Abdominal aortic aneurysm 5.2 cm diameter would consider repair at 5.5 cm.  This is slightly increased from CT angiogram in March 2021 but different imaging techniques per so probably similar size  2.  Peripheral arterial disease patent left femoral to posterior tibial artery bypass  3.  Left renal mass now treated with cryo technique has follow-up scheduled regarding this.  4.  Thyroid nodule was due for ultrasound follow-up of his thyroid in March.  He states his primary care physician is following this.  Plan: Follow-up 6 months ultrasound of abdominal aortic aneurysm and graft duplex and ABIs  Ruta Hinds, MD [facility] [place] Office: [phone]

## 2020-11-05 DIAGNOSIS — M47812 Spondylosis without myelopathy or radiculopathy, cervical region: Secondary | ICD-10-CM | POA: Insufficient documentation

## 2020-11-05 DIAGNOSIS — R030 Elevated blood-pressure reading, without diagnosis of hypertension: Secondary | ICD-10-CM | POA: Insufficient documentation

## 2021-01-07 DIAGNOSIS — M5412 Radiculopathy, cervical region: Secondary | ICD-10-CM | POA: Insufficient documentation

## 2021-02-23 ENCOUNTER — Other Ambulatory Visit: Payer: Self-pay

## 2021-02-23 DIAGNOSIS — I739 Peripheral vascular disease, unspecified: Secondary | ICD-10-CM

## 2021-02-23 DIAGNOSIS — I714 Abdominal aortic aneurysm, without rupture, unspecified: Secondary | ICD-10-CM

## 2021-04-01 ENCOUNTER — Ambulatory Visit (INDEPENDENT_AMBULATORY_CARE_PROVIDER_SITE_OTHER): Payer: Medicare Other | Admitting: Vascular Surgery

## 2021-04-01 ENCOUNTER — Ambulatory Visit (INDEPENDENT_AMBULATORY_CARE_PROVIDER_SITE_OTHER)
Admission: RE | Admit: 2021-04-01 | Discharge: 2021-04-01 | Disposition: A | Discharge status: Home / Self Care | Payer: Medicare Other | Source: Ambulatory Visit | Attending: Vascular Surgery | Admitting: Vascular Surgery

## 2021-04-01 ENCOUNTER — Encounter: Payer: Self-pay | Admitting: Vascular Surgery

## 2021-04-01 ENCOUNTER — Other Ambulatory Visit: Payer: Self-pay

## 2021-04-01 ENCOUNTER — Ambulatory Visit (HOSPITAL_COMMUNITY)
Admission: RE | Admit: 2021-04-01 | Discharge: 2021-04-01 | Disposition: A | Discharge status: Home / Self Care | Payer: Medicare Other | Source: Ambulatory Visit | Attending: Vascular Surgery | Admitting: Vascular Surgery

## 2021-04-01 VITALS — BP 152/95 | HR 54 | Temp 98.3°F | Resp 20 | Ht 70.0 in | Wt 196.0 lb

## 2021-04-01 DIAGNOSIS — I714 Abdominal aortic aneurysm, without rupture, unspecified: Secondary | ICD-10-CM

## 2021-04-01 DIAGNOSIS — I739 Peripheral vascular disease, unspecified: Secondary | ICD-10-CM | POA: Insufficient documentation

## 2021-04-01 NOTE — Progress Notes (Signed)
Patient is a 75 year old male who returns for follow-up today.  He previously underwent left femoral to posterior tibial bypass in 2010.  He has had no claudication symptoms since then.  This was done for rest pain.  He also has known abdominal aortic aneurysm.  This last measured 5.2 cm in diameter on ultrasound in November 2021.  He has no abdominal or back pain.  Past Medical History:  Diagnosis Date   Cancer ([place])    squamous cell carcinoma   Edema leg    Hyperlipidemia    Hypertension    Peripheral vascular disease (HCC)     Past Surgical History:  Procedure Laterality Date   CAROTID ENDARTERECTOMY     COLONOSCOPY     July 2016   PR VEIN BYPASS GRAFT,AORTO-FEM-POP  07/2009   left femoral to posterior tibial artery bypass   PROSTATE BIOPSY      Social History   Socioeconomic History   Marital status: Married    Spouse name: Not on file   Number of children: Not on file   Years of education: Not on file   Highest education level: Not on file  Occupational History   Not on file  Tobacco Use   Smoking status: Former    Pack years: 0.00    Types: Cigarettes    Quit date: 07/21/2009    Years since quitting: 11.7   Smokeless tobacco: Never  Vaping Use   Vaping Use: Never used  Substance and Sexual Activity   Alcohol use: Yes    Alcohol/week: 0.0 standard drinks    Comment: rarely   Drug use: No   Sexual activity: Not on file  Other Topics Concern   Not on file  Social History Narrative   Not on file   Social Determinants of Health   Financial Resource Strain: Not on file  Food Insecurity: Not on file  Transportation Needs: Not on file  Physical Activity: Not on file  Stress: Not on file  Social Connections: Not on file  Intimate Partner Violence: Not on file    Review of systems: He has no shortness of breath.  He has no chest pain.  Current Outpatient Medications on File Prior to Visit  Medication Sig Dispense Refill   amLODipine (NORVASC) 5 MG tablet  Take 5 mg by mouth 2 (two) times daily.     aspirin 81 MG tablet Take 81 mg by mouth daily.       beta carotene w/minerals (OCUVITE) tablet Take 1 tablet by mouth daily. Monday- Wednesday- Friday     Bisacodyl (DULCOLAX PO) Take by mouth.       Brexpiprazole (REXULTI PO) Take 1 mg by mouth daily.      docusate sodium (COLACE) 100 MG capsule Take 100 mg by mouth daily.     donepezil (ARICEPT) 5 MG tablet Take 10 mg by mouth at bedtime.      furosemide (LASIX) 20 MG tablet Take 20 mg by mouth daily.     guaiFENesin (MUCINEX) 600 MG 12 hr tablet Take 600 mg by mouth as needed.     LORazepam (ATIVAN) 1 MG tablet Take 1 mg by mouth 3 (three) times daily.     Multiple Vitamin (MULTIVITAMIN) tablet Take 1 tablet by mouth daily.     psyllium (METAMUCIL) 58.6 % powder Take 1 packet by mouth 2 (two) times daily. Tablets bid     rosuvastatin (CRESTOR) 5 MG tablet Take by mouth.     tadalafil (CIALIS) 20  MG tablet Take 20 mg by mouth as needed.     vitamin B-12 (CYANOCOBALAMIN) 500 MCG tablet Take by mouth.     sertraline (ZOLOFT) 100 MG tablet Take 1.5 tablets by mouth daily.     No current facility-administered medications on file prior to visit.    Physical exam:  Vitals:   04/01/21 0950  BP: (!) 152/95  Pulse: (!) 54  Resp: 20  Temp: 98.3 F (36.8 C)  SpO2: 92%  Weight: 196 lb (88.9 kg)  Height: 5\' 10"  (1.778 m)    Abdomen: Soft nontender nondistended vaguely palpable pulsatile mass in the epigastrium nontender  Extremities: 2+ femoral pulses, 1+ left posterior tibial pulse  Data: Patient had a duplex ultrasound today of his abdominal aorta.  Diameter today was 5.0 cm.  He also had a graft duplex scan which showed no evidence of narrowing and ABIs greater than 1 bilaterally.  Assessment: #1 peripheral arterial disease patent left posterior tibial bypass with normal ABIs asymptomatic  2.  5 cm abdominal aortic aneurysm.  Asymptomatic.  No significant growth since March 2021.   Consideration for repair at 5.5 cm diameter.  Ruta Hinds, MD [facility] Office: [phone]

## 2021-04-02 ENCOUNTER — Other Ambulatory Visit: Payer: Self-pay

## 2021-04-02 DIAGNOSIS — I714 Abdominal aortic aneurysm, without rupture, unspecified: Secondary | ICD-10-CM

## 2021-04-14 DIAGNOSIS — Z6825 Body mass index (BMI) 25.0-25.9, adult: Secondary | ICD-10-CM | POA: Insufficient documentation

## 2021-08-29 ENCOUNTER — Other Ambulatory Visit: Payer: Self-pay

## 2021-08-29 DIAGNOSIS — I739 Peripheral vascular disease, unspecified: Secondary | ICD-10-CM

## 2021-09-23 DIAGNOSIS — F1120 Opioid dependence, uncomplicated: Secondary | ICD-10-CM | POA: Insufficient documentation

## 2021-10-01 ENCOUNTER — Ambulatory Visit (HOSPITAL_COMMUNITY)
Admission: RE | Admit: 2021-10-01 | Discharge: 2021-10-01 | Disposition: A | Discharge status: Home / Self Care | Payer: Medicare Other | Source: Ambulatory Visit | Attending: Vascular Surgery | Admitting: Vascular Surgery

## 2021-10-01 ENCOUNTER — Other Ambulatory Visit: Payer: Self-pay

## 2021-10-01 ENCOUNTER — Ambulatory Visit (HOSPITAL_COMMUNITY): Payer: Medicare Other

## 2021-10-01 ENCOUNTER — Ambulatory Visit: Payer: Medicare Other

## 2021-10-01 DIAGNOSIS — I739 Peripheral vascular disease, unspecified: Secondary | ICD-10-CM

## 2021-10-01 DIAGNOSIS — I714 Abdominal aortic aneurysm, without rupture, unspecified: Secondary | ICD-10-CM

## 2021-11-03 IMAGING — MR MR ABDOMEN WO/W CM
17 series · 48 of 48 positions shown · IV contrast (multihance)
Comparison: Multiple exams, including 12/17/2019 and prior
abdominal MRI from 07/01/2014

CLINICAL DATA: Left renal mass for further characterization

EXAM:
MRI ABDOMEN WITHOUT AND WITH CONTRAST
TECHNIQUE: Multiplanar multisequence MR imaging of the abdomen was performed
both before and after the administration of intravenous contrast.
CONTRAST:  20mL MULTIHANCE GADOBENATE DIMEGLUMINE 529 MG/ML IV SOLN

[Series 3: T2 · coronal · 5.0mm · 1.56mm/px · 2 of 36 slices shown (1 of 3)]
[im 1/36]
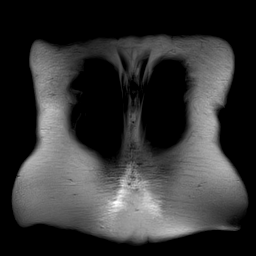
[im 36/36]
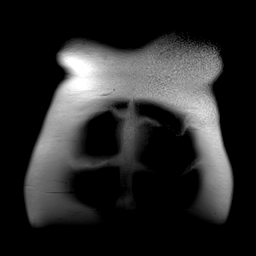

[Series 4: T1 · axial · 3.0mm · 1.19mm/px · z∈[-130,+107]mm · 6 of 160 slices shown]
[im 1/160]
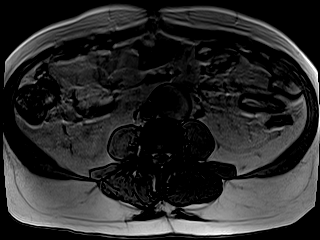
[im 32/160]
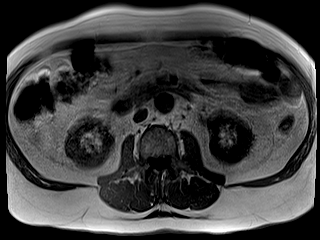
[im 64/160]
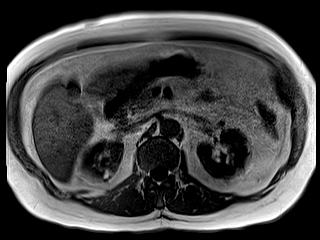
[im 96/160]
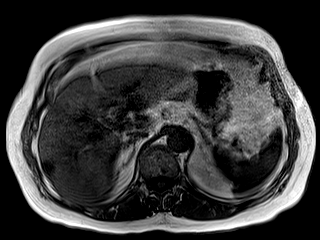
[im 128/160]
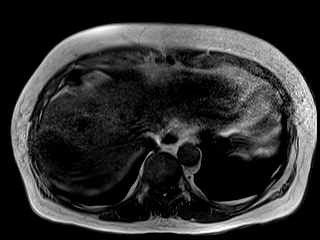
[im 160/160]
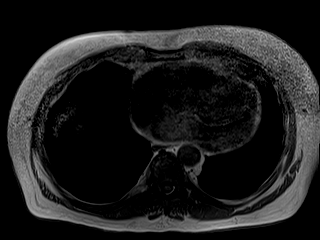

[Series 5: T2 · axial · 5.0mm · 1.48mm/px · z∈[-133,+137]mm · 2 of 46 slices shown (2 of 3)]
[im 1/46]
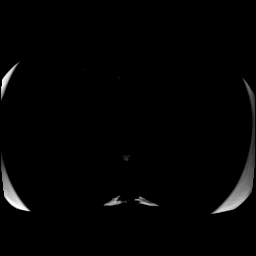
[im 46/46]
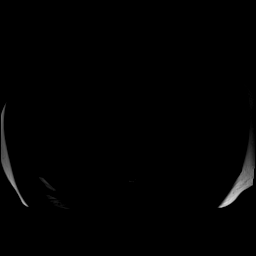

[Series 6: DWI · axial · 5.0mm · 1.42mm/px · z∈[-136,+140]mm · 5 of 141 slices shown (1 of 2)]
[im 1/141]
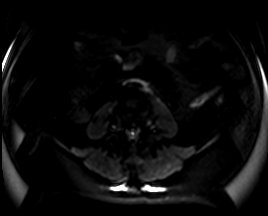
[im 36/141]
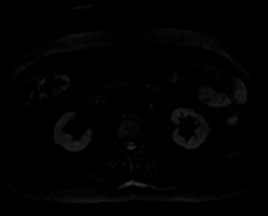
[im 71/141]
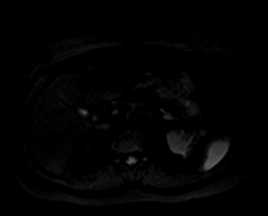
[im 106/141]
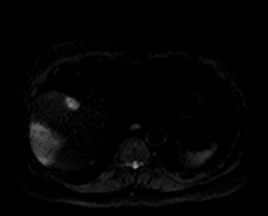
[im 141/141]
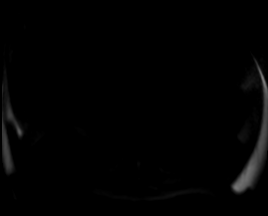

[Series 7: DWI · axial · 5.0mm · 1.42mm/px · z∈[-136,+140]mm · 2 of 47 slices shown (2 of 2)]
[im 1/47]
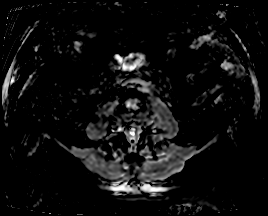
[im 47/47]
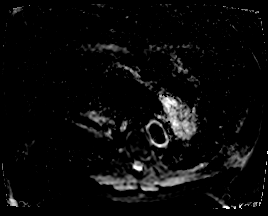

[Series 8: T2 · axial · 6.0mm · 1.22mm/px · 1 of 36 slices shown (3 of 3)]
[im 1/36]
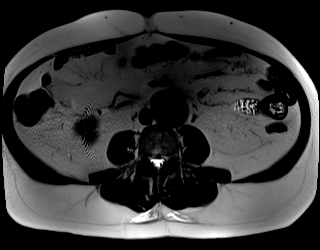

[Series 9: bSSFP · axial · 5.0mm · 1.25mm/px · 1 of 44 slices shown]
[im 1/44]
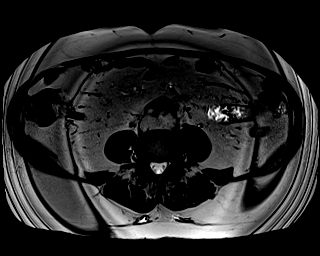

[Series 10: T1 dynamic · axial · non-contrast · 3.0mm · 1.25mm/px · z∈[-142,+143]mm · 3 of 96 slices shown]
[im 1/96]
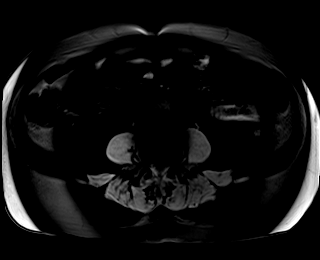
[im 48/96]
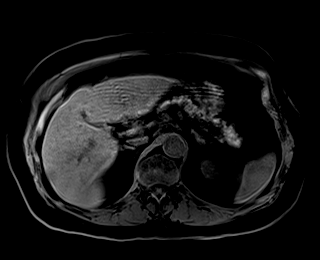
[im 96/96]
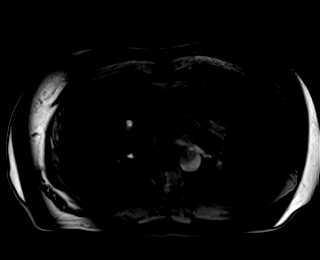

[Series 11: T1 dynamic post-contrast · axial · 3.0mm · 1.25mm/px · z∈[-142,+143]mm · 3 of 96 slices shown (1 of 9)]
[im 1/96]
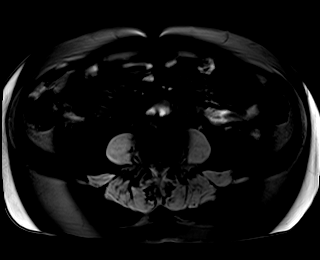
[im 48/96]
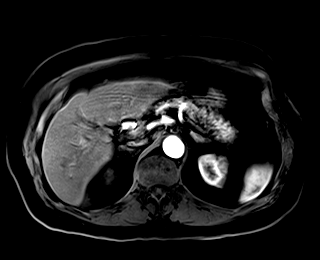
[im 96/96]
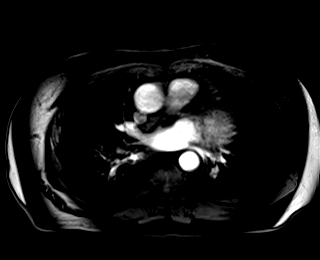

[Series 12: T1 dynamic post-contrast · axial · 3.0mm · 1.25mm/px · z∈[-142,+143]mm · 3 of 96 slices shown (2 of 9)]
[im 1/96]
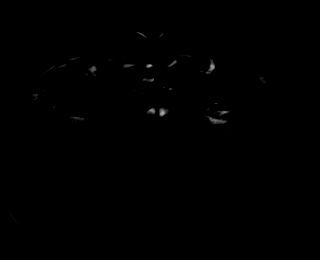
[im 48/96]
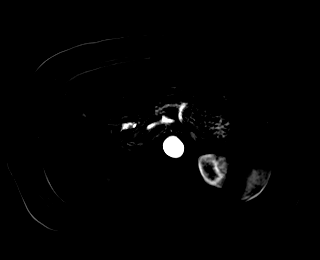
[im 96/96]
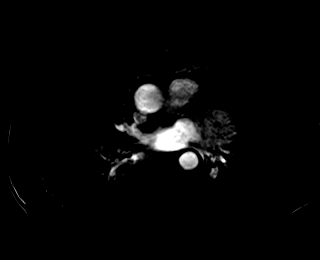

[Series 13: T1 dynamic post-contrast · axial · 3.0mm · 1.25mm/px · z∈[-142,+143]mm · 3 of 96 slices shown (3 of 9)]
[im 1/96]
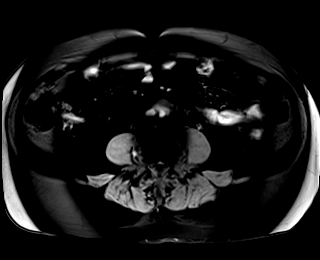
[im 48/96]
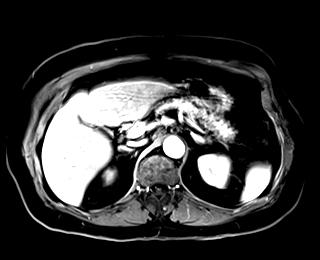
[im 96/96]
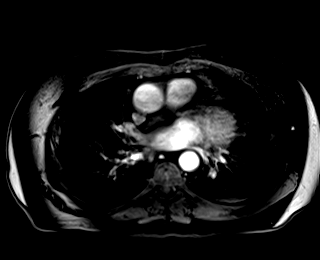

[Series 14: T1 dynamic post-contrast · axial · 3.0mm · 1.25mm/px · z∈[-142,+143]mm · 3 of 96 slices shown (4 of 9)]
[im 1/96]
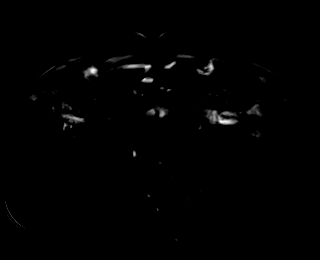
[im 48/96]
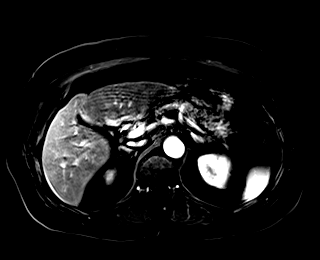
[im 96/96]
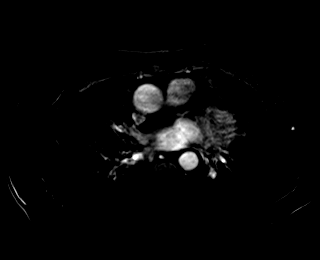

[Series 15: T1 dynamic post-contrast · axial · 3.0mm · 1.25mm/px · z∈[-142,+143]mm · 3 of 96 slices shown (5 of 9)]
[im 1/96]
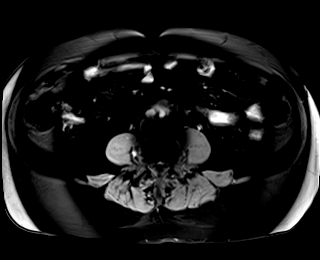
[im 48/96]
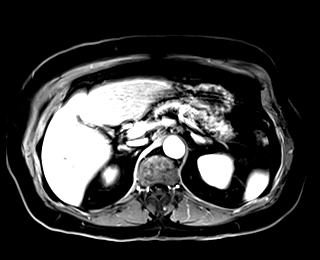
[im 96/96]
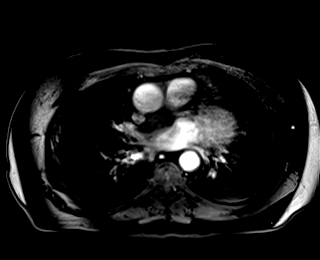

[Series 16: T1 dynamic post-contrast · axial · 3.0mm · 1.25mm/px · z∈[-142,+143]mm · 3 of 96 slices shown (6 of 9)]
[im 1/96]
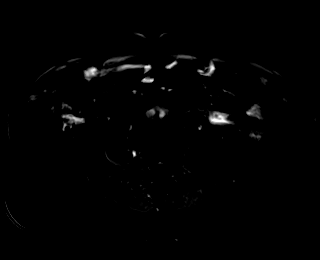
[im 48/96]
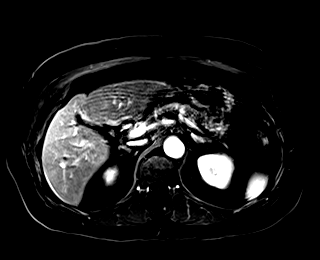
[im 96/96]
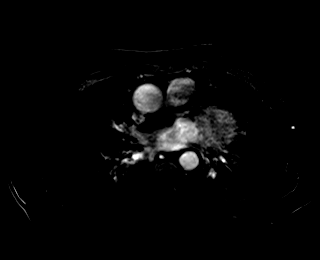

[Series 17: T1 dynamic post-contrast · coronal · 3.0mm · 1.25mm/px · 2 of 72 slices shown (7 of 9)]
[im 1/72]
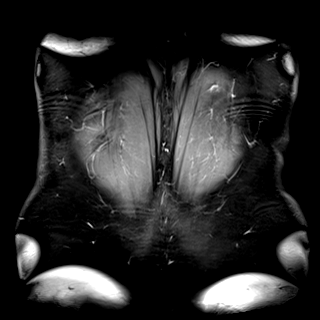
[im 72/72]
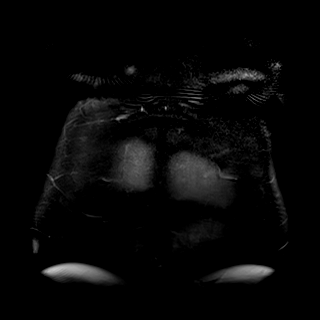

[Series 18: T1 dynamic post-contrast · axial · 3.0mm · 1.25mm/px · z∈[-142,+143]mm · 3 of 96 slices shown (8 of 9)]
[im 1/96]
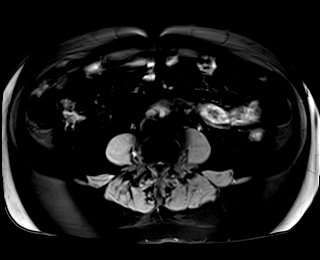
[im 48/96]
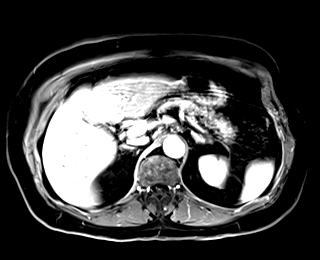
[im 96/96]
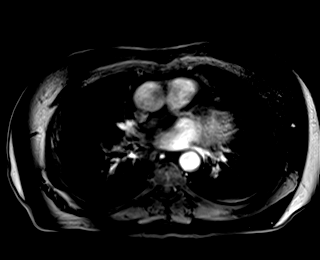

[Series 19: T1 dynamic post-contrast · axial · 3.0mm · 1.25mm/px · z∈[-142,+143]mm · 3 of 96 slices shown (9 of 9)]
[im 1/96]
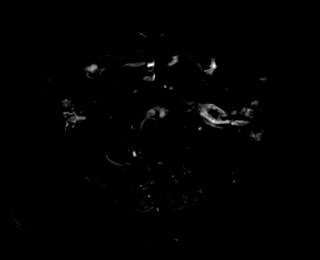
[im 48/96]
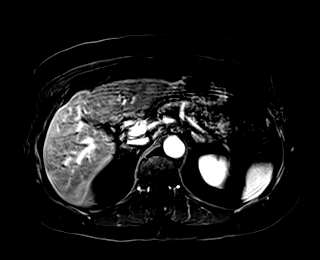
[im 96/96]
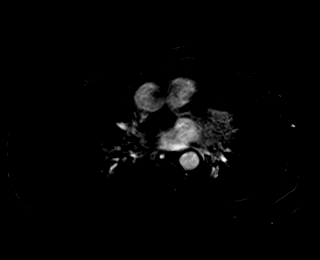

[48 of 48 positions shown; findings below may reference images not displayed]

FINDINGS: Despite efforts by the technologist and patient, severe motion
artifact is present on today's exam and could not be eliminated.
This reduces exam sensitivity and specificity.

Lower chest: Unremarkable

Hepatobiliary: Geographic hepatic steatosis. Multiple hepatic
hemangiomas, the largest of which measures 6.8 by 5.5 cm on image
[DATE]. 0.5 cm left hepatic lobe cyst on image 25/15. No specific
worrisome hepatic lesions. The gallbladder appears unremarkable.

Pancreas:  Unremarkable

Spleen:  Unremarkable

Adrenals/Urinary Tract: The lesion of concern in the left mid kidney
measures 3.9 by 2.0 by 2.0 cm and has mixed T2 signal
characteristics (for example on image [DATE]) and irregular internal
enhancement. No definite internal fat signal intensity. I do not see
main left renal vein thrombosis. Subtle adrenal nodularity without a
well-defined mass. Left kidney upper pole simple appearing cyst.

Stomach/Bowel: Unremarkable

Vascular/Lymphatic: Infrarenal abdominal aortic aneurysm as
described on recent CT angiogram, with associated mural thrombus.
Previous recommendations still apply.

No pathologic adenopathy is identified.

Other:  No supplemental non-categorized findings.

Musculoskeletal: Small hemangioma eccentric to the right in the L2
vertebral body.
IMPRESSION: 1. 3.9 by 2.0 by 2.0 cm left mid kidney lesion with irregular
internal enhancement, suspicious for renal cell carcinoma. The
lesion extends minimally into the renal pelvis. No main left renal
vein thrombosis or adenopathy.
2. Geographic hepatic steatosis. Scattered benign hepatic
hemangiomas.
3. Infrarenal abdominal aortic aneurysm as described on recent CT
angiogram. Previous recommendations for follow up still apply.

## 2021-11-15 ENCOUNTER — Other Ambulatory Visit: Payer: Self-pay

## 2021-11-15 DIAGNOSIS — D649 Anemia, unspecified: Secondary | ICD-10-CM | POA: Insufficient documentation

## 2021-11-15 DIAGNOSIS — E119 Type 2 diabetes mellitus without complications: Secondary | ICD-10-CM | POA: Insufficient documentation

## 2021-11-15 DIAGNOSIS — L989 Disorder of the skin and subcutaneous tissue, unspecified: Secondary | ICD-10-CM | POA: Insufficient documentation

## 2021-11-15 DIAGNOSIS — K0252 Dental caries on pit and fissure surface penetrating into dentin: Secondary | ICD-10-CM | POA: Insufficient documentation

## 2021-11-15 DIAGNOSIS — H905 Unspecified sensorineural hearing loss: Secondary | ICD-10-CM | POA: Insufficient documentation

## 2021-11-15 DIAGNOSIS — K036 Deposits [accretions] on teeth: Secondary | ICD-10-CM | POA: Insufficient documentation

## 2021-11-15 DIAGNOSIS — I110 Hypertensive heart disease with heart failure: Secondary | ICD-10-CM | POA: Insufficient documentation

## 2021-11-15 DIAGNOSIS — E860 Dehydration: Secondary | ICD-10-CM | POA: Insufficient documentation

## 2021-11-15 DIAGNOSIS — N400 Enlarged prostate without lower urinary tract symptoms: Secondary | ICD-10-CM | POA: Insufficient documentation

## 2021-11-15 DIAGNOSIS — K219 Gastro-esophageal reflux disease without esophagitis: Secondary | ICD-10-CM | POA: Insufficient documentation

## 2021-11-15 DIAGNOSIS — E042 Nontoxic multinodular goiter: Secondary | ICD-10-CM | POA: Insufficient documentation

## 2021-11-15 DIAGNOSIS — R509 Fever, unspecified: Secondary | ICD-10-CM | POA: Insufficient documentation

## 2021-11-15 DIAGNOSIS — E039 Hypothyroidism, unspecified: Secondary | ICD-10-CM | POA: Insufficient documentation

## 2021-11-15 DIAGNOSIS — I6523 Occlusion and stenosis of bilateral carotid arteries: Secondary | ICD-10-CM | POA: Insufficient documentation

## 2021-11-15 DIAGNOSIS — I779 Disorder of arteries and arterioles, unspecified: Secondary | ICD-10-CM

## 2021-11-15 DIAGNOSIS — Z9189 Other specified personal risk factors, not elsewhere classified: Secondary | ICD-10-CM | POA: Insufficient documentation

## 2021-11-15 DIAGNOSIS — F329 Major depressive disorder, single episode, unspecified: Secondary | ICD-10-CM | POA: Insufficient documentation

## 2021-11-15 DIAGNOSIS — F332 Major depressive disorder, recurrent severe without psychotic features: Secondary | ICD-10-CM | POA: Insufficient documentation

## 2021-11-15 DIAGNOSIS — F411 Generalized anxiety disorder: Secondary | ICD-10-CM | POA: Insufficient documentation

## 2021-11-15 DIAGNOSIS — R531 Weakness: Secondary | ICD-10-CM | POA: Insufficient documentation

## 2021-11-15 DIAGNOSIS — E291 Testicular hypofunction: Secondary | ICD-10-CM | POA: Insufficient documentation

## 2021-11-15 DIAGNOSIS — E559 Vitamin D deficiency, unspecified: Secondary | ICD-10-CM | POA: Insufficient documentation

## 2021-11-15 DIAGNOSIS — Z77098 Contact with and (suspected) exposure to other hazardous, chiefly nonmedicinal, chemicals: Secondary | ICD-10-CM | POA: Insufficient documentation

## 2021-11-15 DIAGNOSIS — N210 Calculus in bladder: Secondary | ICD-10-CM | POA: Insufficient documentation

## 2021-11-15 DIAGNOSIS — C61 Malignant neoplasm of prostate: Secondary | ICD-10-CM | POA: Insufficient documentation

## 2021-11-15 DIAGNOSIS — F0390 Unspecified dementia without behavioral disturbance: Secondary | ICD-10-CM | POA: Insufficient documentation

## 2021-11-15 DIAGNOSIS — J309 Allergic rhinitis, unspecified: Secondary | ICD-10-CM | POA: Insufficient documentation

## 2021-11-15 DIAGNOSIS — Z79899 Other long term (current) drug therapy: Secondary | ICD-10-CM | POA: Insufficient documentation

## 2021-11-15 DIAGNOSIS — M25571 Pain in right ankle and joints of right foot: Secondary | ICD-10-CM | POA: Insufficient documentation

## 2021-11-15 DIAGNOSIS — H9313 Tinnitus, bilateral: Secondary | ICD-10-CM | POA: Insufficient documentation

## 2021-11-15 DIAGNOSIS — I700 Atherosclerosis of aorta: Secondary | ICD-10-CM | POA: Insufficient documentation

## 2021-11-15 DIAGNOSIS — C649 Malignant neoplasm of unspecified kidney, except renal pelvis: Secondary | ICD-10-CM | POA: Insufficient documentation

## 2021-11-15 DIAGNOSIS — R110 Nausea: Secondary | ICD-10-CM | POA: Insufficient documentation

## 2021-11-15 DIAGNOSIS — I714 Abdominal aortic aneurysm, without rupture, unspecified: Secondary | ICD-10-CM

## 2021-11-15 NOTE — Progress Notes (Signed)
[facility] [place]  ASSESSMENT / PLAN: Levi Mcconnell is a 76 y.o. male with a 85 mm infrarenal abdominal aortic aneurysm.  He has a history of left femoral to posterior tibial artery bypass with greater saphenous vein done by Dr. Oneida Mcconnell.  A statement from the [employer] and Society for Vascular [employer] estimated the annual rupture risk according to AAA diameter to be the following: 5.0 cm to 5.9 cm in diameter - 3% to 15%  The patient is not yet a candidate for elective repair of the aneurysm to prevent rupture.  Recommend the following to reduce the risk of major adverse cardiac / limb events.  Complete cessation from all tobacco products. Excellent blood glucose control with goal A1c < 7%. Blood pressure control with goal blood pressure < 140/90 mmHg. Excellent lipid reduction therapy with goal LDL-C <100 mg/dL. Aspirin 81mg  PO QD.  Atorvastatin 40-80mg  PO QD (or other "high intensity" statin therapy).  Plan to see the patient again in a year with repeat duplex of the abdominal aorta.  We will repeat surveillance of his left femoral-tibial bypass at that time.  CHIEF COMPLAINT: Abdominal aortic aneurysm surveillance  HISTORY OF PRESENT ILLNESS: Levi Mcconnell is a 76 y.o. male who presents to clinic for ongoing surveillance of peripheral arterial disease and abdominal aortic aneurysm.  The patient is a long term patient of Dr. Oneida Mcconnell.  He did a left femoral to posterior tibial artery bypass for him in 2010. Levi Mcconnell took Dr. Oneida Mcconnell recommendations to heart and stop smoking at that time.  His bypasses and joint primary patency since.  He has a known infrarenal abdominal aortic aneurysm which has been slowly growing.  He is asymptomatic from this.  We reviewed the rationale for surveillance and the threshold for indication.  I counseled him about the 2 different types of aneurysm repair.  Past Medical  History:  Diagnosis Date   Cancer ([place])    squamous cell carcinoma   Edema leg    Hyperlipidemia    Hypertension    Peripheral vascular disease (HCC)     Past Surgical History:  Procedure Laterality Date   CAROTID ENDARTERECTOMY     COLONOSCOPY     July 2016   PR VEIN BYPASS GRAFT,AORTO-FEM-POP  07/2009   left femoral to posterior tibial artery bypass   PROSTATE BIOPSY      Family History  Problem Relation Age of Onset   Other Levi Mcconnell        thoracic aneurysm   Heart disease Levi Mcconnell        AAA-   After age 12   Hyperlipidemia Levi Mcconnell    Hypertension Levi Mcconnell    Stroke Levi Mcconnell        Massive   Heart attack Levi Mcconnell    Hyperlipidemia Levi Mcconnell    Hypertension Levi Mcconnell    Heart disease Levi Mcconnell     Social History   Socioeconomic History   Marital status: Married    Spouse name: Not on file   Number of children: Not on file   Years of education: Not on file   Highest education level: Not on file  Occupational History   Not on file  Tobacco Use   Smoking status: Former    Types: Cigarettes    Quit date: 07/21/2009    Years since quitting: 12.3   Smokeless tobacco: Never  Vaping Use   Vaping Use: Never used  Substance and Sexual Activity  Alcohol use: Yes    Alcohol/week: 0.0 standard drinks    Comment: rarely   Drug use: No   Sexual activity: Not on file  Other Topics Concern   Not on file  Social History Narrative   Not on file   Social Determinants of Health   Financial Resource Strain: Not on file  Food Insecurity: Not on file  Transportation Needs: Not on file  Physical Activity: Not on file  Stress: Not on file  Social Connections: Not on file  Intimate Partner Violence: Not on file    Allergies  Allergen Reactions   Effexor [Venlafaxine] Nausea And Vomiting   Sulfa Antibiotics Rash    Current Outpatient Medications  Medication Sig Dispense Refill   amLODipine (NORVASC) 5 MG tablet Take 5 mg by mouth 2 (two) times daily.     aspirin 81 MG tablet Take 81 mg  by mouth daily.       beta carotene w/minerals (OCUVITE) tablet Take 1 tablet by mouth daily. Monday- Wednesday- Friday     Bisacodyl (DULCOLAX PO) Take by mouth.       Brexpiprazole (REXULTI PO) Take 1 mg by mouth daily.      docusate sodium (COLACE) 100 MG capsule Take 100 mg by mouth daily.     donepezil (ARICEPT) 5 MG tablet Take 10 mg by mouth at bedtime.      furosemide (LASIX) 20 MG tablet Take 20 mg by mouth daily.     guaiFENesin (MUCINEX) 600 MG 12 hr tablet Take 600 mg by mouth as needed.     LORazepam (ATIVAN) 1 MG tablet Take 1 mg by mouth 3 (three) times daily.     Multiple Vitamin (MULTIVITAMIN) tablet Take 1 tablet by mouth daily.     psyllium (METAMUCIL) 58.6 % powder Take 1 packet by mouth 2 (two) times daily. Tablets bid     rosuvastatin (CRESTOR) 5 MG tablet Take by mouth.     tadalafil (CIALIS) 20 MG tablet Take 20 mg by mouth as needed.     vitamin B-12 (CYANOCOBALAMIN) 500 MCG tablet Take by mouth.     sertraline (ZOLOFT) 100 MG tablet Take 1.5 tablets by mouth daily.     No current facility-administered medications for this visit.    REVIEW OF SYSTEMS:  [X]  denotes positive finding, [ ]  denotes negative finding Cardiac  Comments:  Chest pain or chest pressure:    Shortness of breath upon exertion:    Short of breath when lying flat:    Irregular heart rhythm:        Vascular    Pain in calf, thigh, or hip brought on by ambulation:    Pain in feet at night that wakes you up from your sleep:     Blood clot in your veins:    Leg swelling:         Pulmonary    Oxygen at home:    Productive cough:     Wheezing:         Neurologic    Sudden weakness in arms or legs:     Sudden numbness in arms or legs:     Sudden onset of difficulty speaking or slurred speech:    Temporary loss of vision in one eye:     Problems with dizziness:         Gastrointestinal    Blood in stool:     Vomited blood:         Genitourinary  Burning when urinating:     Blood  in urine:        Psychiatric    Major depression:         Hematologic    Bleeding problems:    Problems with blood clotting too easily:        Skin    Rashes or ulcers:        Constitutional    Fever or chills:      PHYSICAL EXAM Vitals:   11/16/21 1038  BP: 131/87  Pulse: (!) 52  Resp: 20  Temp: 98.3 F (36.8 C)  SpO2: 98%  Weight: 200 lb (90.7 kg)  Height: 5\' 10"  (1.778 m)   Constitutional: well appearing in no distress. Appears well nourished.  Neurologic: CN intact.  No focal findings.  Psychiatric: Mood and affect symmetric and appropriate. Eyes: No icterus. No conjunctival pallor. Ears, nose, throat: mucous membranes moist. Midline trachea.  Cardiac: Regular rate and rhythm.  Respiratory: unlabored. Abdominal: soft, non-tender, non-distended.  Peripheral vascular:  2+ posterior tibial pulses bilaterally Extremity: No edema. No cyanosis. No pallor.  Skin: No gangrene. No ulceration.  Lymphatic: No Stemmer's sign. No palpable lymphadenopathy.   PERTINENT LABORATORY AND RADIOLOGIC DATA  Most recent CBC CBC Latest Ref Rng & Units 07/27/2009 07/26/2009 07/25/2009  WBC 4.0 - 10.5 K/uL 9.5 8.3 10.4  Hemoglobin 13.0 - 17.0 g/dL 10.8(L) 10.8(L) 11.2(L)  Hematocrit 39.0 - 52.0 % 31.1(L) 31.3(L) 32.7(L)  Platelets 150 - 400 K/uL 265 218 210     Most recent CMP CMP Latest Ref Rng & Units 07/22/2009 07/21/2009 07/21/2009  Glucose 70 - 99 mg/dL 135(H) 149(H) 146(H)  BUN 6 - 23 mg/dL 5(L) - -  Creatinine 0.4 - 1.5 mg/dL 0.72 - -  Sodium 135 - 145 mEq/L 138 137 138  Potassium 3.5 - 5.1 mEq/L 3.5 3.7 3.4(L)  Chloride 96 - 112 mEq/L 103 - -  CO2 19 - 32 mEq/L 30 - -  Calcium 8.4 - 10.5 mg/dL 7.7(L) - -  Total Protein 6.0 - 8.3 g/dL - - -  Total Bilirubin 0.3 - 1.2 mg/dL - - -  Alkaline Phos 39 - 117 U/L - - -  AST 0 - 37 U/L - - -  ALT 0 - 53 U/L - - -   +-------+-----------+-----------+------------+------------+   ABI/TBI Today's ABI Today's TBI Previous  ABI Previous TBI   +-------+-----------+-----------+------------+------------+   Right   0.94        0.7         1.11         0.96           +-------+-----------+-----------+------------+------------+   Left    1.09        0.67        1.21         0.84           +-------+-----------+-----------+------------+------------+   Left femorotibial bypass duplex personally reviewed.  1 area of low velocity in the graft at the distal anastomosis.  No evidence of hemodynamically significant stenosis at any point in the graft.  Abdominal aortic aneurysm duplex.  Personally reviewed.  52 mm infrarenal abdominal aortic aneurysm.  Yevonne Aline. Stanford Breed, MD [facility] Phone Number: [phone] 11/16/2021 12:26 PM  Total time spent on preparing this encounter including chart review, data review, collecting history, examining the patient, coordinating care for this established patient, 30 minutes.  Portions of this report may have been  transcribed using voice recognition software.  Every effort has been made to ensure accuracy; however, inadvertent computerized transcription errors may still be present.

## 2021-11-16 ENCOUNTER — Ambulatory Visit (INDEPENDENT_AMBULATORY_CARE_PROVIDER_SITE_OTHER): Payer: Medicare Other | Admitting: Vascular Surgery

## 2021-11-16 ENCOUNTER — Other Ambulatory Visit: Payer: Self-pay

## 2021-11-16 ENCOUNTER — Ambulatory Visit (INDEPENDENT_AMBULATORY_CARE_PROVIDER_SITE_OTHER)
Admission: RE | Admit: 2021-11-16 | Discharge: 2021-11-16 | Disposition: A | Discharge status: Home / Self Care | Payer: Medicare Other | Source: Ambulatory Visit | Attending: Vascular Surgery | Admitting: Vascular Surgery

## 2021-11-16 ENCOUNTER — Encounter: Payer: Self-pay | Admitting: Vascular Surgery

## 2021-11-16 ENCOUNTER — Ambulatory Visit (HOSPITAL_COMMUNITY)
Admission: RE | Admit: 2021-11-16 | Discharge: 2021-11-16 | Disposition: A | Discharge status: Home / Self Care | Payer: Medicare Other | Source: Ambulatory Visit | Attending: Vascular Surgery | Admitting: Vascular Surgery

## 2021-11-16 ENCOUNTER — Ambulatory Visit: Payer: Medicare Other

## 2021-11-16 VITALS — BP 131/87 | HR 52 | Temp 98.3°F | Resp 20 | Ht 70.0 in | Wt 200.0 lb

## 2021-11-16 DIAGNOSIS — I7143 Infrarenal abdominal aortic aneurysm, without rupture: Secondary | ICD-10-CM

## 2021-11-16 DIAGNOSIS — I739 Peripheral vascular disease, unspecified: Secondary | ICD-10-CM | POA: Insufficient documentation

## 2021-11-16 DIAGNOSIS — I714 Abdominal aortic aneurysm, without rupture, unspecified: Secondary | ICD-10-CM | POA: Insufficient documentation

## 2021-11-16 DIAGNOSIS — I779 Disorder of arteries and arterioles, unspecified: Secondary | ICD-10-CM

## 2022-11-11 ENCOUNTER — Other Ambulatory Visit: Payer: Self-pay | Admitting: *Deleted

## 2022-11-11 DIAGNOSIS — I7143 Infrarenal abdominal aortic aneurysm, without rupture: Secondary | ICD-10-CM

## 2022-11-11 DIAGNOSIS — I739 Peripheral vascular disease, unspecified: Secondary | ICD-10-CM

## 2022-11-11 DIAGNOSIS — I714 Abdominal aortic aneurysm, without rupture, unspecified: Secondary | ICD-10-CM

## 2022-11-11 DIAGNOSIS — I779 Disorder of arteries and arterioles, unspecified: Secondary | ICD-10-CM

## 2022-11-21 NOTE — Progress Notes (Unsigned)
[facility] OF [place]  ASSESSMENT / PLAN: Levi Mcconnell is a 77 y.o. male with a 21 mm infrarenal abdominal aortic aneurysm.  He has a history of left femoral to posterior tibial artery bypass with greater saphenous vein done by Dr. Oneida Mcconnell.  A statement from the [place] [employer] and Society for Vascular Surgery estimated the annual rupture risk according to AAA diameter to be the following: 5.0 cm to 5.9 cm in diameter - 3% to 15%  The patient is not yet a candidate for elective repair of the aneurysm to prevent rupture.  Recommend the following to reduce the risk of major adverse cardiac / limb events.  Complete cessation from all tobacco products. Excellent blood glucose control with goal A1c < 7%. Blood pressure control with goal blood pressure < 140/90 mmHg. Excellent lipid reduction therapy with goal LDL-C <100 mg/dL. Aspirin 50m PO QD.  Atorvastatin 40-80mg PO QD (or other "high intensity" statin therapy).  Plan to see the patient again in six months with repeat duplex of the abdominal aorta.  We will repeat surveillance of his left femoral-tibial bypass at that time.  CHIEF COMPLAINT: Abdominal aortic aneurysm surveillance  HISTORY OF PRESENT ILLNESS: Levi Mcconnell a 77y.o. male who presents to clinic for ongoing surveillance of peripheral arterial disease and abdominal aortic aneurysm.  The patient is a long term patient of Dr. FOneida Mcconnell  He did a left femoral to posterior tibial artery bypass for him in 2010. Levi Mcconnell Dr. FOneida Alarrecommendations to heart and stop smoking at that time.  His bypasses and joint primary patency since.  He has a known infrarenal abdominal aortic aneurysm which has been slowly growing.  He is asymptomatic from this.  We reviewed the rationale for surveillance and the threshold for indication.  I counseled him about the 2 different types of aneurysm repair.  11/22/22:  doing well overall. No abdominal issues. No problems with walking or the legs.   Past Medical History:  Diagnosis Date   Cancer ([place]    squamous cell carcinoma   Edema leg    Hyperlipidemia    Hypertension    Peripheral vascular disease (HCC)     Past Surgical History:  Procedure Laterality Date   CAROTID ENDARTERECTOMY     COLONOSCOPY     July 2016   PR VEIN BYPASS GRAFT,AORTO-FEM-POP  07/2009   left femoral to posterior tibial artery bypass   PROSTATE BIOPSY      Family History  Problem Relation Age of Onset   Other Father        thoracic aneurysm   Heart disease Father        AAA-   After age 78  Hyperlipidemia Mother    Hypertension Mother    Stroke Mother        Massive   Heart attack Son    Hyperlipidemia Son    Hypertension Son    Heart disease Son     Social History   Socioeconomic History   Marital status: Married    Spouse name: Not on file   Number of children: Not on file   Years of education: Not on file   Highest education level: Not on file  Occupational History   Not on file  Tobacco Use   Smoking status: Former    Types: Cigarettes    Quit date: 07/21/2009    Years since quitting: 13.3   Smokeless  tobacco: Never  Vaping Use   Vaping Use: Never used  Substance and Sexual Activity   Alcohol use: Yes    Alcohol/week: 0.0 standard drinks of alcohol    Comment: rarely   Drug use: No   Sexual activity: Not on file  Other Topics Concern   Not on file  Social History Narrative   Not on file   Social Determinants of Health   Financial Resource Strain: Not on file  Food Insecurity: Not on file  Transportation Needs: Not on file  Physical Activity: Not on file  Stress: Not on file  Social Connections: Not on file  Intimate Partner Violence: Not on file    Allergies  Allergen Reactions   Effexor [Venlafaxine] Nausea And Vomiting   Sulfa Antibiotics Rash    Current Outpatient Medications  Medication Sig Dispense Refill    amLODipine (NORVASC) 5 MG tablet Take 5 mg by mouth 2 (two) times daily.     aspirin 81 MG tablet Take 81 mg by mouth daily.       beta carotene w/minerals (OCUVITE) tablet Take 1 tablet by mouth daily. Monday- Wednesday- Friday     Bisacodyl (DULCOLAX PO) Take by mouth.       Brexpiprazole (REXULTI PO) Take 1 mg by mouth daily.      docusate sodium (COLACE) 100 MG capsule Take 100 mg by mouth daily.     donepezil (ARICEPT) 5 MG tablet Take 10 mg by mouth at bedtime.      furosemide (LASIX) 20 MG tablet Take 20 mg by mouth daily.     guaiFENesin (MUCINEX) 600 MG 12 hr tablet Take 600 mg by mouth as needed.     HYDROcodone-acetaminophen (NORCO/VICODIN) 5-325 MG tablet Take 1-2 tablets by mouth every 6 (six) hours as needed.     LORazepam (ATIVAN) 1 MG tablet Take 1 mg by mouth 3 (three) times daily.     Multiple Vitamin (MULTIVITAMIN) tablet Take 1 tablet by mouth daily.     psyllium (METAMUCIL) 58.6 % powder Take 1 packet by mouth 2 (two) times daily. Tablets bid     rosuvastatin (CRESTOR) 5 MG tablet Take by mouth.     tadalafil (CIALIS) 20 MG tablet Take 20 mg by mouth as needed.     vitamin B-12 (CYANOCOBALAMIN) 500 MCG tablet Take by mouth.     sertraline (ZOLOFT) 100 MG tablet Take 1.5 tablets by mouth daily.     No current facility-administered medications for this visit.    PHYSICAL EXAM Vitals:   11/22/22 1004  BP: 138/85  Pulse: (!) 57  Resp: 20  Temp: 98.2 F (36.8 C)  SpO2: 97%  Weight: 201 lb (91.2 kg)  Height: 5' 10"$  (1.778 m)    Constitutional: well appearing in no distress. Appears well nourished.  Neurologic: CN intact.  No focal findings.  Psychiatric: Mood and affect symmetric and appropriate. Eyes: No icterus. No conjunctival pallor. Ears, nose, throat: mucous membranes moist. Midline trachea.  Cardiac: Regular rate and rhythm.  Respiratory: unlabored. Abdominal: soft, non-tender, non-distended.  Peripheral vascular:  2+ posterior tibial pulses  bilaterally Extremity: No edema. No cyanosis. No pallor.  Skin: No gangrene. No ulceration.  Lymphatic: No Stemmer's sign. No palpable lymphadenopathy.   PERTINENT LABORATORY AND RADIOLOGIC DATA  Most recent CBC    Latest Ref Rng & Units 07/27/2009    4:10 AM 07/26/2009    3:46 AM 07/25/2009    5:24 AM  CBC  WBC 4.0 - 10.5 K/uL 9.5  8.3  10.4   Hemoglobin 13.0 - 17.0 g/dL 10.8  10.8  11.2   Hematocrit 39.0 - 52.0 % 31.1  31.3  32.7   Platelets 150 - 400 K/uL 265  218  210      Most recent CMP    Latest Ref Rng & Units 07/22/2009    3:45 AM 07/21/2009    1:17 PM 07/21/2009   12:08 PM  CMP  Glucose 70 - 99 mg/dL 135  149  146   BUN 6 - 23 mg/dL 5     Creatinine 0.4 - 1.5 mg/dL 0.72     Sodium 135 - 145 mEq/L 138  137  138   Potassium 3.5 - 5.1 mEq/L 3.5  3.7  3.4   Chloride 96 - 112 mEq/L 103     CO2 19 - 32 mEq/L 30     Calcium 8.4 - 10.5 mg/dL 7.7       +-------+-----------+-----------+------------+------------+  ABI/TBIToday's ABIToday's TBIPrevious ABIPrevious TBI  +-------+-----------+-----------+------------+------------+  Right 1.08       0.86       0.94        0.7           +-------+-----------+-----------+------------+------------+  Left  1.13       0.84       1.09        0.67          +-------+-----------+-----------+------------+------------+   Left femorotibial bypass duplex personally reviewed.  1 area of low velocity in the outflow. This has been present on serial scans.  No evidence of hemodynamically significant stenosis at any point in the graft.  Abdominal aortic aneurysm duplex.  Personally reviewed.  48 mm infrarenal abdominal aortic aneurysm.  Yevonne Aline. Stanford Breed, MD [facility] Phone Number: [phone] 11/22/2022 12:00 PM  Total time spent on preparing this encounter including chart review, data review, collecting history, examining the patient, coordinating care for this established  patient, 30 minutes.  Portions of this report may have been transcribed using voice recognition software.  Every effort has been made to ensure accuracy; however, inadvertent computerized transcription errors may still be present.

## 2022-11-22 ENCOUNTER — Ambulatory Visit (HOSPITAL_COMMUNITY)
Admission: RE | Admit: 2022-11-22 | Discharge: 2022-11-22 | Disposition: A | Discharge status: Home / Self Care | Payer: Medicare Other | Source: Ambulatory Visit | Attending: Vascular Surgery | Admitting: Vascular Surgery

## 2022-11-22 ENCOUNTER — Encounter: Payer: Self-pay | Admitting: Vascular Surgery

## 2022-11-22 ENCOUNTER — Ambulatory Visit (INDEPENDENT_AMBULATORY_CARE_PROVIDER_SITE_OTHER)
Admission: RE | Admit: 2022-11-22 | Discharge: 2022-11-22 | Disposition: A | Discharge status: Home / Self Care | Payer: Medicare Other | Source: Ambulatory Visit | Attending: Vascular Surgery | Admitting: Vascular Surgery

## 2022-11-22 ENCOUNTER — Ambulatory Visit (INDEPENDENT_AMBULATORY_CARE_PROVIDER_SITE_OTHER): Payer: Medicare Other | Admitting: Vascular Surgery

## 2022-11-22 VITALS — BP 138/85 | HR 57 | Temp 98.2°F | Resp 20 | Ht 70.0 in | Wt 201.0 lb

## 2022-11-22 DIAGNOSIS — I739 Peripheral vascular disease, unspecified: Secondary | ICD-10-CM | POA: Insufficient documentation

## 2022-11-22 DIAGNOSIS — I7143 Infrarenal abdominal aortic aneurysm, without rupture: Secondary | ICD-10-CM

## 2022-11-22 DIAGNOSIS — I779 Disorder of arteries and arterioles, unspecified: Secondary | ICD-10-CM

## 2022-11-22 DIAGNOSIS — I714 Abdominal aortic aneurysm, without rupture, unspecified: Secondary | ICD-10-CM | POA: Diagnosis present

## 2022-11-23 ENCOUNTER — Other Ambulatory Visit: Payer: Self-pay

## 2022-11-23 DIAGNOSIS — I739 Peripheral vascular disease, unspecified: Secondary | ICD-10-CM

## 2022-11-23 DIAGNOSIS — I7143 Infrarenal abdominal aortic aneurysm, without rupture: Secondary | ICD-10-CM

## 2022-11-23 LAB — VAS US ABI WITH/WO TBI
Left ABI: 1.13
Right ABI: 1.08

## 2023-05-16 ENCOUNTER — Ambulatory Visit (INDEPENDENT_AMBULATORY_CARE_PROVIDER_SITE_OTHER)
Admission: RE | Admit: 2023-05-16 | Discharge: 2023-05-16 | Disposition: A | Discharge status: Home / Self Care | Payer: Medicare Other | Source: Ambulatory Visit | Attending: Vascular Surgery | Admitting: Vascular Surgery

## 2023-05-16 ENCOUNTER — Ambulatory Visit (HOSPITAL_COMMUNITY)
Admission: RE | Admit: 2023-05-16 | Discharge: 2023-05-16 | Disposition: A | Discharge status: Home / Self Care | Payer: Medicare Other | Source: Ambulatory Visit | Attending: Vascular Surgery | Admitting: Vascular Surgery

## 2023-05-16 ENCOUNTER — Encounter: Payer: Self-pay | Admitting: Vascular Surgery

## 2023-05-16 ENCOUNTER — Ambulatory Visit (INDEPENDENT_AMBULATORY_CARE_PROVIDER_SITE_OTHER): Payer: Medicare Other | Admitting: Vascular Surgery

## 2023-05-16 VITALS — BP 150/85 | HR 54 | Temp 99.1°F | Resp 18 | Ht 70.0 in | Wt 196.3 lb

## 2023-05-16 DIAGNOSIS — I7143 Infrarenal abdominal aortic aneurysm, without rupture: Secondary | ICD-10-CM

## 2023-05-16 DIAGNOSIS — I739 Peripheral vascular disease, unspecified: Secondary | ICD-10-CM | POA: Diagnosis not present

## 2023-05-16 DIAGNOSIS — I714 Abdominal aortic aneurysm, without rupture, unspecified: Secondary | ICD-10-CM

## 2023-05-16 LAB — VAS US ABI WITH/WO TBI
Left ABI: 1.11
Right ABI: 1.01

## 2023-05-16 NOTE — Progress Notes (Unsigned)
[facility] OF [place]  ASSESSMENT / PLAN: Levi Mcconnell is a 77 y.o. male with a 52 mm infrarenal abdominal aortic aneurysm.  He has a history of left femoral to posterior tibial artery bypass with greater saphenous vein done by Dr. Darrick Mcconnell.  A statement from the [employer] of the [employer] for Vascular Surgery and Society for Vascular Surgery estimated the annual rupture risk according to AAA diameter to be the following: 5.0 cm to 5.9 cm in diameter - 3% to 15%  The patient is not yet a candidate for elective repair of the aneurysm to prevent rupture.  Recommend the following to reduce the risk of major adverse cardiac / limb events.  Complete cessation from all tobacco products. Excellent blood glucose control with goal A1c < 7%. Blood pressure control with goal blood pressure < 140/90 mmHg. Excellent lipid reduction therapy with goal LDL-C <100 mg/dL. Aspirin 81mg  PO QD.  Atorvastatin 40-80mg  PO QD (or other "high intensity" statin therapy).  Plan to see the patient again in six months with repeat duplex of the abdominal aorta.  We will repeat surveillance of his left femoral-tibial bypass at that time.  CHIEF COMPLAINT: Abdominal aortic aneurysm surveillance  HISTORY OF PRESENT ILLNESS: Levi Mcconnell is a 77 y.o. male who presents to clinic for ongoing surveillance of peripheral arterial disease and abdominal aortic aneurysm.  The patient is a long term patient of Dr. Darrick Mcconnell.  He did a left femoral to posterior tibial artery bypass for him in 2010. Levi Mcconnell took Dr. Darrick Mcconnell recommendations to heart and stop smoking at that time.  His bypasses and joint primary patency since.  He has a known infrarenal abdominal aortic aneurysm which has been slowly growing.  He is asymptomatic from this.  We reviewed the rationale for surveillance and the threshold for indication.  I counseled him about the 2 different types of aneurysm repair.  11/22/22:  doing well overall. No abdominal issues. No problems with walking or the legs.   Past Medical History:  Diagnosis Date   Cancer (HCC)    squamous cell carcinoma   Edema leg    Hyperlipidemia    Hypertension    Peripheral vascular disease (HCC)     Past Surgical History:  Procedure Laterality Date   CAROTID ENDARTERECTOMY     COLONOSCOPY     July 2016   PR VEIN BYPASS GRAFT,AORTO-FEM-POP  07/2009   left femoral to posterior tibial artery bypass   PROSTATE BIOPSY      Family History  Problem Relation Age of Onset   Other Father        thoracic aneurysm   Heart disease Father        AAA-   After age 50   Hyperlipidemia Mother    Hypertension Mother    Stroke Mother        Massive   Heart attack Son    Hyperlipidemia Son    Hypertension Son    Heart disease Son     Social History   Socioeconomic History   Marital status: Married    Spouse name: Not on file   Number of children: Not on file   Years of education: Not on file   Highest education level: Not on file  Occupational History   Not on file  Tobacco Use   Smoking status: Former    Current packs/day: 0.00    Types: Cigarettes    Quit date: 07/21/2009    Years  since quitting: 13.8   Smokeless tobacco: Never  Vaping Use   Vaping status: Never Used  Substance and Sexual Activity   Alcohol use: Yes    Alcohol/week: 0.0 standard drinks of alcohol    Comment: rarely   Drug use: No   Sexual activity: Not on file  Other Topics Concern   Not on file  Social History Narrative   Not on file   Social Determinants of Health   Financial Resource Strain: Not on file  Food Insecurity: Not on file  Transportation Needs: Not on file  Physical Activity: Not on file  Stress: Not on file  Social Connections: Not on file  Intimate Partner Violence: Not on file    Allergies  Allergen Reactions   Effexor [Venlafaxine] Nausea And Vomiting    Current Outpatient Medications  Medication Sig Dispense Refill    amLODipine (NORVASC) 5 MG tablet Take 5 mg by mouth 2 (two) times daily.     aspirin 81 MG tablet Take 81 mg by mouth daily.       beta carotene w/minerals (OCUVITE) tablet Take 1 tablet by mouth daily. Monday- Wednesday- Friday     Bisacodyl (DULCOLAX PO) Take by mouth.       Brexpiprazole (REXULTI PO) Take 1 mg by mouth daily.      docusate sodium (COLACE) 100 MG capsule Take 100 mg by mouth daily.     donepezil (ARICEPT) 5 MG tablet Take 10 mg by mouth at bedtime.      furosemide (LASIX) 20 MG tablet Take 20 mg by mouth daily.     guaiFENesin (MUCINEX) 600 MG 12 hr tablet Take 600 mg by mouth as needed.     HYDROcodone-acetaminophen (NORCO/VICODIN) 5-325 MG tablet Take 1-2 tablets by mouth every 6 (six) hours as needed.     LORazepam (ATIVAN) 1 MG tablet Take 1 mg by mouth 3 (three) times daily.     Multiple Vitamin (MULTIVITAMIN) tablet Take 1 tablet by mouth daily.     psyllium (METAMUCIL) 58.6 % powder Take 1 packet by mouth 2 (two) times daily. Tablets bid     rosuvastatin (CRESTOR) 5 MG tablet Take by mouth.     sertraline (ZOLOFT) 100 MG tablet Take 1.5 tablets by mouth daily.     tadalafil (CIALIS) 20 MG tablet Take 20 mg by mouth as needed.     vitamin B-12 (CYANOCOBALAMIN) 500 MCG tablet Take by mouth.     No current facility-administered medications for this visit.    PHYSICAL EXAM Vitals:   05/16/23 0949  BP: (!) 150/85  Pulse: (!) 54  Resp: 18  Temp: 99.1 F (37.3 C)  TempSrc: Temporal  SpO2: 95%  Weight: 196 lb 4.8 oz (89 kg)  Height: 5\' 10"  (1.778 m)    Constitutional: well appearing in no distress. Appears well nourished.  Neurologic: CN intact.  No focal findings.  Psychiatric: Mood and affect symmetric and appropriate. Eyes: No icterus. No conjunctival pallor. Ears, nose, throat: mucous membranes moist. Midline trachea.  Cardiac: Regular rate and rhythm.  Respiratory: unlabored. Abdominal: soft, non-tender, non-distended.  Peripheral vascular:  2+  posterior tibial pulses bilaterally Extremity: No edema. No cyanosis. No pallor.  Skin: No gangrene. No ulceration.  Lymphatic: No Stemmer's sign. No palpable lymphadenopathy.   PERTINENT LABORATORY AND RADIOLOGIC DATA  Most recent CBC    Latest Ref Rng & Units 07/27/2009    4:10 AM 07/26/2009    3:46 AM 07/25/2009    5:24 AM  CBC  WBC 4.0 - 10.5 K/uL 9.5  8.3  10.4   Hemoglobin 13.0 - 17.0 g/dL 16.1  09.6  04.5   Hematocrit 39.0 - 52.0 % 31.1  31.3  32.7   Platelets 150 - 400 K/uL 265  218  210      Most recent CMP    Latest Ref Rng & Units 07/22/2009    3:45 AM 07/21/2009    1:17 PM 07/21/2009   12:08 PM  CMP  Glucose 70 - 99 mg/dL 409  811  914   BUN 6 - 23 mg/dL 5     Creatinine 0.4 - 1.5 mg/dL 7.82     Sodium 956 - 213 mEq/L 138  137  138   Potassium 3.5 - 5.1 mEq/L 3.5  3.7  3.4   Chloride 96 - 112 mEq/L 103     CO2 19 - 32 mEq/L 30     Calcium 8.4 - 10.5 mg/dL 7.7       +-------+-----------+-----------+------------+------------+  ABI/TBIToday's ABIToday's TBIPrevious ABIPrevious TBI  +-------+-----------+-----------+------------+------------+  Right 1.08       0.86       0.94        0.7           +-------+-----------+-----------+------------+------------+  Left  1.13       0.84       1.09        0.67          +-------+-----------+-----------+------------+------------+   Left femorotibial bypass duplex personally reviewed.  1 area of low velocity in the outflow. This has been present on serial scans.  No evidence of hemodynamically significant stenosis at any point in the graft.  Abdominal aortic aneurysm duplex.  Personally reviewed.  48 mm infrarenal abdominal aortic aneurysm.  Rande Brunt. Lenell Antu, MD [facility] of [facility] Phone Number: [phone] 05/16/2023 12:43 PM  Total time spent on preparing this encounter including chart review, data review, collecting history, examining the patient, coordinating care  for this established patient, 30 minutes.  Portions of this report may have been transcribed using voice recognition software.  Every effort has been made to ensure accuracy; however, inadvertent computerized transcription errors may still be present.

## 2023-05-24 ENCOUNTER — Other Ambulatory Visit: Payer: Self-pay

## 2023-05-24 DIAGNOSIS — I714 Abdominal aortic aneurysm, without rupture, unspecified: Secondary | ICD-10-CM

## 2024-05-13 ENCOUNTER — Other Ambulatory Visit: Payer: Self-pay | Admitting: *Deleted

## 2024-05-13 DIAGNOSIS — I714 Abdominal aortic aneurysm, without rupture, unspecified: Secondary | ICD-10-CM

## 2024-06-10 NOTE — Progress Notes (Unsigned)
 [facility]IN SPECIALISTS OF [place]  ASSESSMENT / PLAN: STEPHANOS Mcconnell is a 78 y.o. male with a 52 mm infrarenal abdominal aortic aneurysm.  He has a history of left femoral to posterior tibial artery bypass with greater saphenous vein done by Levi Mcconnell.  Recommend:  Abstinence from all tobacco products. Blood glucose control with goal A1c < 7%. Blood pressure control with goal blood pressure < 130/80 mmHg. Lipid reduction therapy with goal LDL-C < 55 mg/dL. Aspirin 81mg  by mouth daily. Atorvastatin 40-80mg  PO QD (or other high intensity statin therapy).  Needs ABI and LLE duplex. Will call patient with results.   CHIEF COMPLAINT: Abdominal aortic aneurysm surveillance  HISTORY OF PRESENT ILLNESS: Levi Mcconnell is a 78 y.o. male who presents to clinic for ongoing surveillance of peripheral arterial disease and abdominal aortic aneurysm.  The patient is a long term patient of Levi Mcconnell.  He did a left femoral to posterior tibial artery bypass for him in 2010.  Levi Mcconnell took Levi Mcconnell recommendations to heart and stopped smoking at that time.  His bypasses and joint primary patency since.  He has a known infrarenal abdominal aortic aneurysm which has been slowly growing.  He is asymptomatic from this.  We reviewed the rationale for surveillance and the threshold for indication.  I counseled him about the 2 different types of aneurysm repair.  11/22/22: doing well overall. No abdominal issues. No problems with walking or the legs.   05/17/23: No complaints today.  We reviewed his noninvasive testing.  He is not yet at the threshold for aneurysm intervention.  His bypass is widely patent.  He has no symptoms of peripheral arterial disease.  Past Medical History:  Diagnosis Date   Cancer (HCC)    squamous cell carcinoma   Edema leg    Hyperlipidemia    Hypertension    Peripheral vascular disease (HCC)     Past Surgical History:  Procedure Laterality Date    CAROTID ENDARTERECTOMY     COLONOSCOPY     July 2016   PR VEIN BYPASS GRAFT,AORTO-FEM-POP  07/2009   left femoral to posterior tibial artery bypass   PROSTATE BIOPSY      Family History  Problem Relation Age of Onset   Other Father        thoracic aneurysm   Heart disease Father        AAA-   After age 46   Hyperlipidemia Mother    Hypertension Mother    Stroke Mother        Massive   Heart attack Son    Hyperlipidemia Son    Hypertension Son    Heart disease Son     Social History   Socioeconomic History   Marital status: Married    Spouse name: Not on file   Number of children: Not on file   Years of education: Not on file   Highest education level: Not on file  Occupational History   Not on file  Tobacco Use   Smoking status: Former    Current packs/day: 0.00    Types: Cigarettes    Quit date: 07/21/2009    Years since quitting: 14.8   Smokeless tobacco: Never  Vaping Use   Vaping status: Never Used  Substance and Sexual Activity   Alcohol use: Yes    Alcohol/week: 0.0 standard drinks of alcohol    Comment: rarely   Drug use: No   Sexual activity: Not on file  Other  Topics Concern   Not on file  Social History Narrative   Not on file   Social Drivers of Health   Financial Resource Strain: Not on file  Food Insecurity: Not on file  Transportation Needs: Not on file  Physical Activity: Not on file  Stress: Not on file  Social Connections: Not on file  Intimate Partner Violence: Not on file    Allergies  Allergen Reactions   Effexor [Venlafaxine] Nausea And Vomiting    Current Outpatient Medications  Medication Sig Dispense Refill   amLODipine (NORVASC) 5 MG tablet Take 5 mg by mouth 2 (two) times daily.     aspirin 81 MG tablet Take 81 mg by mouth daily.       beta carotene w/minerals (OCUVITE) tablet Take 1 tablet by mouth daily. Monday- Wednesday- Friday     Bisacodyl (DULCOLAX PO) Take by mouth.       Brexpiprazole (REXULTI PO) Take 1 mg  by mouth daily.      docusate sodium (COLACE) 100 MG capsule Take 100 mg by mouth daily.     donepezil (ARICEPT) 5 MG tablet Take 10 mg by mouth at bedtime.      furosemide (LASIX) 20 MG tablet Take 20 mg by mouth daily.     guaiFENesin (MUCINEX) 600 MG 12 hr tablet Take 600 mg by mouth as needed.     HYDROcodone-acetaminophen (NORCO/VICODIN) 5-325 MG tablet Take 1-2 tablets by mouth every 6 (six) hours as needed.     LORazepam (ATIVAN) 1 MG tablet Take 1 mg by mouth 3 (three) times daily.     Multiple Vitamin (MULTIVITAMIN) tablet Take 1 tablet by mouth daily.     psyllium (METAMUCIL) 58.6 % powder Take 1 packet by mouth 2 (two) times daily. Tablets bid     rosuvastatin (CRESTOR) 5 MG tablet Take by mouth.     sertraline (ZOLOFT) 100 MG tablet Take 1.5 tablets by mouth daily.     tadalafil (CIALIS) 20 MG tablet Take 20 mg by mouth as needed.     vitamin B-12 (CYANOCOBALAMIN) 500 MCG tablet Take by mouth.     No current facility-administered medications for this visit.    PHYSICAL EXAM There were no vitals filed for this visit.   Constitutional: well appearing in no distress. Appears well nourished.  Neurologic: CN intact.  No focal findings.  Psychiatric: Mood and affect symmetric and appropriate. Eyes: No icterus. No conjunctival pallor. Ears, nose, throat: mucous membranes moist. Midline trachea.  Cardiac: Regular rate and rhythm.  Respiratory: unlabored. Abdominal: soft, non-tender, non-distended.  Peripheral vascular:  2+ posterior tibial pulses bilaterally Extremity: No edema. No cyanosis. No pallor.  Skin: No gangrene. No ulceration.  Lymphatic: No Stemmer's sign. No palpable lymphadenopathy.   PERTINENT LABORATORY AND RADIOLOGIC DATA  Most recent CBC    Latest Ref Rng & Units 07/27/2009    4:10 AM 07/26/2009    3:46 AM 07/25/2009    5:24 AM  CBC  WBC 4.0 - 10.5 K/uL 9.5  8.3  10.4   Hemoglobin 13.0 - 17.0 g/dL 89.1  89.1  88.7   Hematocrit 39.0 - 52.0 % 31.1   31.3  32.7   Platelets 150 - 400 K/uL 265  218  210      Most recent CMP    Latest Ref Rng & Units 07/22/2009    3:45 AM 07/21/2009    1:17 PM 07/21/2009   12:08 PM  CMP  Glucose 70 - 99 mg/dL 864  149  146   BUN 6 - 23 mg/dL 5     Creatinine 0.4 - 1.5 mg/dL 9.27     Sodium 864 - 854 mEq/L 138  137  138   Potassium 3.5 - 5.1 mEq/L 3.5  3.7  3.4   Chloride 96 - 112 mEq/L 103     CO2 19 - 32 mEq/L 30     Calcium 8.4 - 10.5 mg/dL 7.7       +-------+-----------+-----------+------------+------------+  ABI/TBIToday's ABIToday's TBIPrevious ABIPrevious TBI  +-------+-----------+-----------+------------+------------+  Right 1.01       0.85       1.08        0.86          +-------+-----------+-----------+------------+------------+  Left  1.11       0.71       1.13        0.84          +-------+-----------+-----------+------------+------------+   Left: Patent graft with no stenosis. Graft velocties <40 cm/s.   Abdominal aortic aneurysm duplex.  Personally reviewed.  49 mm infrarenal abdominal aortic aneurysm.  Debby SAILOR. Magda, MD [facility] of [facility] Phone Number: [phone] 06/10/2024 4:52 PM  Total time spent on preparing this encounter including chart review, data review, collecting history, examining the patient, coordinating care for this established patient, 30 minutes.  Portions of this report may have been transcribed using voice recognition software.  Every effort has been made to ensure accuracy; however, inadvertent computerized transcription errors may still be present.

## 2024-06-11 ENCOUNTER — Ambulatory Visit: Attending: Vascular Surgery | Admitting: Vascular Surgery

## 2024-06-11 ENCOUNTER — Ambulatory Visit (HOSPITAL_COMMUNITY)
Admission: RE | Admit: 2024-06-11 | Discharge: 2024-06-11 | Disposition: A | Discharge status: Home / Self Care | Source: Ambulatory Visit | Attending: Vascular Surgery | Admitting: Vascular Surgery

## 2024-06-11 ENCOUNTER — Encounter: Payer: Self-pay | Admitting: Vascular Surgery

## 2024-06-11 VITALS — BP 143/93 | HR 54 | Temp 97.7°F | Ht 70.0 in | Wt 197.0 lb

## 2024-06-11 DIAGNOSIS — I7143 Infrarenal abdominal aortic aneurysm, without rupture: Secondary | ICD-10-CM

## 2024-06-11 DIAGNOSIS — I714 Abdominal aortic aneurysm, without rupture, unspecified: Secondary | ICD-10-CM | POA: Insufficient documentation

## 2024-06-13 ENCOUNTER — Other Ambulatory Visit: Payer: Self-pay | Admitting: *Deleted

## 2024-06-13 DIAGNOSIS — I7143 Infrarenal abdominal aortic aneurysm, without rupture: Secondary | ICD-10-CM

## 2024-06-13 DIAGNOSIS — I739 Peripheral vascular disease, unspecified: Secondary | ICD-10-CM

## 2024-12-10 ENCOUNTER — Encounter (HOSPITAL_COMMUNITY)

## 2024-12-10 ENCOUNTER — Ambulatory Visit: Admitting: Vascular Surgery

## 2024-12-17 ENCOUNTER — Encounter (HOSPITAL_COMMUNITY)

## 2024-12-17 ENCOUNTER — Ambulatory Visit: Admitting: Vascular Surgery

## 2024-12-17 ENCOUNTER — Other Ambulatory Visit (HOSPITAL_COMMUNITY)
